# Patient Record
Sex: Female | Born: 1975
Health system: Southern US, Community
[De-identification: ages and names within clinical notes are randomized; demographics above are authoritative.]

## PROBLEM LIST (undated history)

## (undated) DIAGNOSIS — O139 Gestational [pregnancy-induced] hypertension without significant proteinuria, unspecified trimester: Secondary | ICD-10-CM

## (undated) DIAGNOSIS — E039 Hypothyroidism, unspecified: Secondary | ICD-10-CM

## (undated) DIAGNOSIS — O24419 Gestational diabetes mellitus in pregnancy, unspecified control: Secondary | ICD-10-CM

## (undated) HISTORY — PX: WISDOM TOOTH EXTRACTION: SHX21

## (undated) HISTORY — DX: Gestational diabetes mellitus in pregnancy, unspecified control: O24.419

## (undated) HISTORY — PX: APPENDECTOMY: SHX54

## (undated) HISTORY — DX: Hypothyroidism, unspecified: E03.9

---

## 2009-09-03 DIAGNOSIS — A4902 Methicillin resistant Staphylococcus aureus infection, unspecified site: Secondary | ICD-10-CM | POA: Insufficient documentation

## 2011-12-26 DIAGNOSIS — N6459 Other signs and symptoms in breast: Secondary | ICD-10-CM | POA: Insufficient documentation

## 2011-12-26 DIAGNOSIS — Q078 Other specified congenital malformations of nervous system: Secondary | ICD-10-CM | POA: Insufficient documentation

## 2015-06-14 ENCOUNTER — Inpatient Hospital Stay (HOSPITAL_COMMUNITY)
Admission: AD | Admit: 2015-06-14 | Discharge: 2015-06-14 | Disposition: A | Discharge status: Home / Self Care | Payer: 59 | Source: Ambulatory Visit | Attending: Family Medicine | Admitting: Family Medicine

## 2015-06-14 ENCOUNTER — Encounter (HOSPITAL_COMMUNITY): Payer: Self-pay | Admitting: *Deleted

## 2015-06-14 ENCOUNTER — Emergency Department (HOSPITAL_COMMUNITY)
Admission: EM | Admit: 2015-06-14 | Discharge: 2015-06-14 | Discharge status: Against Medical Advice | Payer: 59 | Attending: Emergency Medicine | Admitting: Emergency Medicine

## 2015-06-14 ENCOUNTER — Emergency Department (HOSPITAL_COMMUNITY): Payer: 59

## 2015-06-14 ENCOUNTER — Encounter (HOSPITAL_COMMUNITY): Payer: Self-pay | Admitting: Emergency Medicine

## 2015-06-14 DIAGNOSIS — Z87891 Personal history of nicotine dependence: Secondary | ICD-10-CM | POA: Insufficient documentation

## 2015-06-14 DIAGNOSIS — R1030 Lower abdominal pain, unspecified: Secondary | ICD-10-CM | POA: Diagnosis not present

## 2015-06-14 DIAGNOSIS — R109 Unspecified abdominal pain: Secondary | ICD-10-CM | POA: Diagnosis not present

## 2015-06-14 DIAGNOSIS — N939 Abnormal uterine and vaginal bleeding, unspecified: Secondary | ICD-10-CM | POA: Insufficient documentation

## 2015-06-14 HISTORY — DX: Gestational (pregnancy-induced) hypertension without significant proteinuria, unspecified trimester: O13.9

## 2015-06-14 LAB — COMPREHENSIVE METABOLIC PANEL
ALT: 23 U/L (ref 14–54)
AST: 27 U/L (ref 15–41)
Albumin: 4.4 g/dL (ref 3.5–5.0)
Alkaline Phosphatase: 73 U/L (ref 38–126)
Anion gap: 9 (ref 5–15)
BUN: 9 mg/dL (ref 6–20)
CHLORIDE: 105 mmol/L (ref 101–111)
CO2: 25 mmol/L (ref 22–32)
CREATININE: 0.7 mg/dL (ref 0.44–1.00)
Calcium: 9.4 mg/dL (ref 8.9–10.3)
GFR calc Af Amer: >60 mL/min (ref >60)
GFR calc non Af Amer: >60 mL/min (ref >60)
Glucose, Bld: 105 mg/dL — ABNORMAL HIGH (ref 65–99)
Potassium: 3.7 mmol/L (ref 3.5–5.1)
SODIUM: 139 mmol/L (ref 135–145)
Total Bilirubin: 0.8 mg/dL (ref 0.3–1.2)
Total Protein: 7.8 g/dL (ref 6.5–8.1)

## 2015-06-14 LAB — CBC
HEMATOCRIT: 39.1 % (ref 36.0–46.0)
Hemoglobin: 12.8 g/dL (ref 12.0–15.0)
MCH: 30.1 pg (ref 26.0–34.0)
MCHC: 32.7 g/dL (ref 30.0–36.0)
MCV: 92 fL (ref 78.0–100.0)
PLATELETS: 386 10*3/uL (ref 150–400)
RBC: 4.25 MIL/uL (ref 3.87–5.11)
RDW: 12.8 % (ref 11.5–15.5)
WBC: 9.6 10*3/uL (ref 4.0–10.5)

## 2015-06-14 LAB — I-STAT BETA HCG BLOOD, ED (MC, WL, AP ONLY): I-stat hCG, quantitative: 129.4 m[IU]/mL — ABNORMAL HIGH (ref <5)

## 2015-06-14 LAB — LIPASE, BLOOD: LIPASE: 15 U/L — AB (ref 22–51)

## 2015-06-14 MED ORDER — OXYCODONE-ACETAMINOPHEN 5-325 MG PO TABS: 2.0000 | ORAL_TABLET | ORAL | Status: DC | PRN

## 2015-06-14 MED ORDER — NALBUPHINE HCL 10 MG/ML IJ SOLN
10.0000 mg | Freq: Once | INTRAMUSCULAR | Status: AC
  Administered 2015-06-14: 10 mg via INTRAMUSCULAR
  Filled 2015-06-14: qty 1

## 2015-06-14 NOTE — Discharge Instructions (Signed)

## 2015-06-14 NOTE — ED Notes (Signed)
Pt c/o low medial abdominal pain onset thirty minutes ago and vaginal bleeding. Denies n/v/diarrhea.

## 2015-06-14 NOTE — MAU Provider Note (Signed)
  History   [id number] visiting from [place] in with severe abd pain that started today. States had d and c for miscarriage in aug was noted to have retained products and given cytotec 2-3 wks ago. statred having abd pain and spotting today severe per pt. Pt has hx of one previous ectopic  CSN: [id number]  Arrival date and time: 06/14/15 1620   None     No chief complaint on file.  HPI  OB History    Gravida Para Term Preterm AB TAB SAB Ectopic Multiple Living   No past medical history on file.  No past surgical history on file.  No family history on file.  Social History  Substance Use Topics  . Smoking status: Former Games developer  . Smokeless tobacco: Not on file  . Alcohol Use: Not on file    Allergies:  Allergies  Allergen Reactions  . Other     Amoxacillin    No prescriptions prior to admission    Review of Systems  Constitutional: Negative.   Eyes: Negative.   Respiratory: Negative.   Cardiovascular: Negative.   Gastrointestinal: Positive for abdominal pain.  Genitourinary: Negative.   Musculoskeletal: Negative.   Skin: Negative.   Neurological: Negative.   Endo/Heme/Allergies: Negative.   Psychiatric/Behavioral: Negative.    Physical Exam   There were no vitals taken for this visit.  Physical Exam  Constitutional: She is oriented to person, place, and time. She appears well-developed and well-nourished.  HENT:  Head: Normocephalic.  Eyes: Pupils are equal, round, and reactive to light.  Neck: Normal range of motion.  Cardiovascular: Normal rate, regular rhythm, normal heart sounds and intact distal pulses.   Respiratory: Effort normal and breath sounds normal.  GI: Soft. Bowel sounds are normal. There is tenderness.  Genitourinary:  sm amt vag bleeding  Musculoskeletal: Normal range of motion.  Neurological: She is alert and oriented to person, place, and time. She has normal reflexes.  Skin: Skin is warm and dry.   Psychiatric: She has a normal mood and affect. Her behavior is normal. Judgment and thought content normal.    MAU Course  Procedures  MDM abd pain  Assessment and Plan  Cbc stable, quant 129, will get [place] to r/o ectopic. [place] normal results will d/c home  LAWSON, MARIE DARLENE 06/14/2015, 4:31 PM

## 2015-06-14 NOTE — MAU Note (Signed)
Pt had a D&C in [place] on Aug 22nd. Pt had a f/u visit with MD on Sep 17th and was told everything was fine.  Extreme pressure pain in low abd began at 1230 today.

## 2015-11-02 IMAGING — US US TRANSVAGINAL NON-OB
1 series · 15 of 25 positions shown · non-contrast
Comparison: None.

CLINICAL DATA: 39-year-old G5 P2 ectopic 1 AB1, LMP 02/06/2015 ([DATE]
weeks 2 days), presenting with severe lower abdominal pain and
pelvic pain which began earlier today. Quantitative beta HCG 129.

EXAM:
TRANSABDOMINAL AND TRANSVAGINAL ULTRASOUND OF PELVIS
TECHNIQUE: Both transabdominal and transvaginal ultrasound examinations of the
pelvis were performed. Transabdominal technique was performed for
global imaging of the pelvis including uterus, ovaries, adnexal
regions, and pelvic cul-de-sac. It was necessary to proceed with
endovaginal exam following the transabdominal exam to visualize the
endometrium and ovaries due to incomplete bladder distension.

[Series 1: us transvaginal non-ob · 15 of 100 slices shown]
[im 1/100]
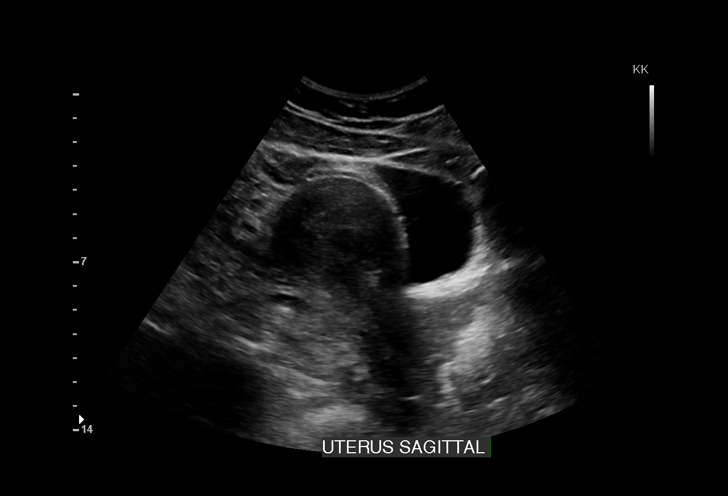
[im 9/100]
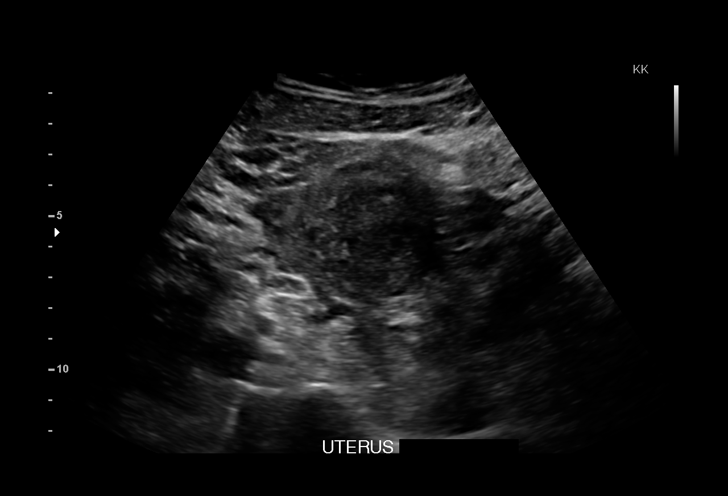
[im 17/100]
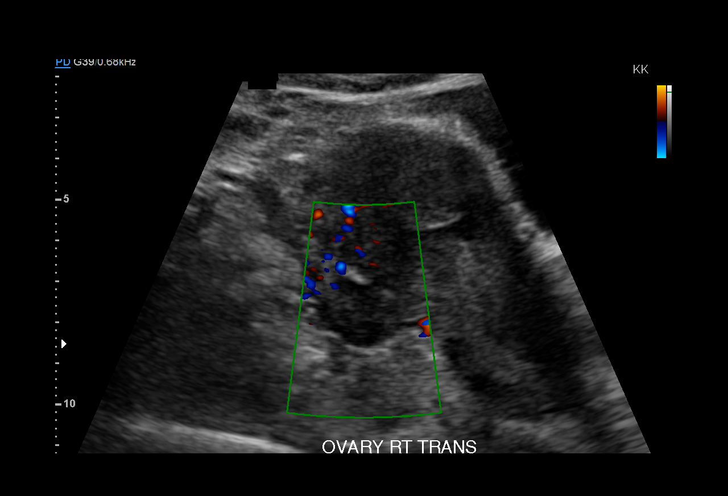
[im 21/100]
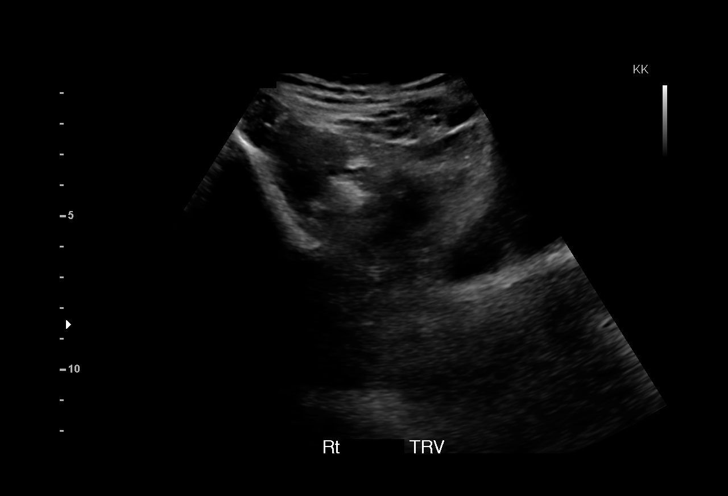
[im 29/100]
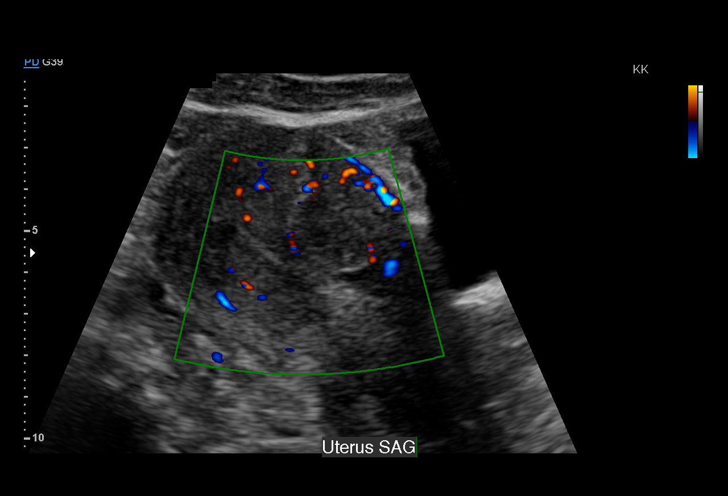
[im 38/100]
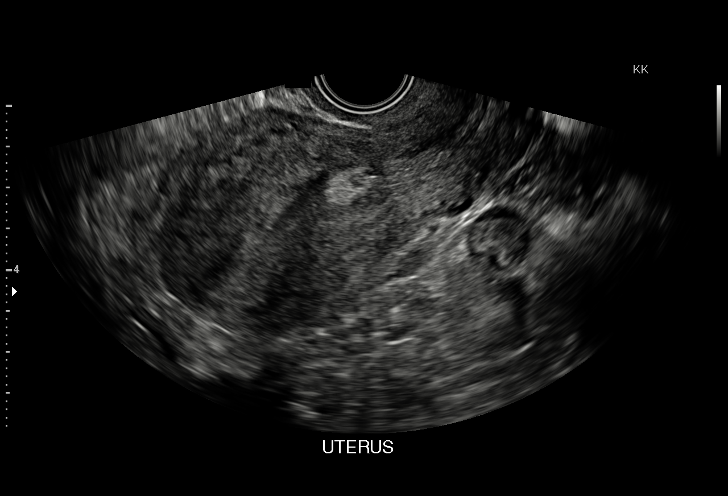
[im 42/100]
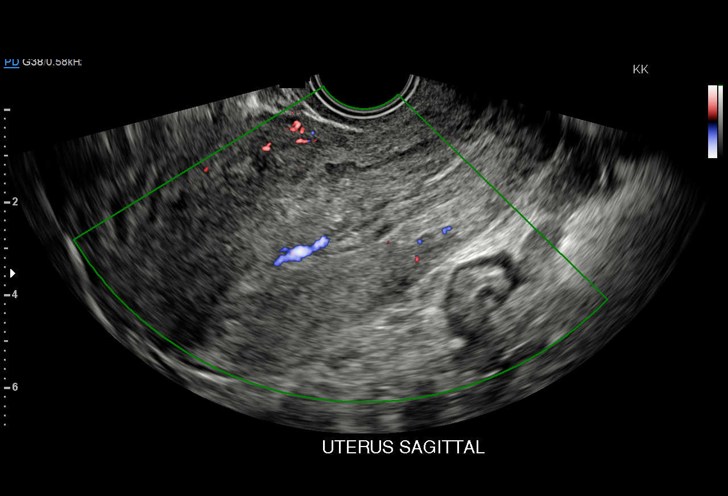
[im 50/100]
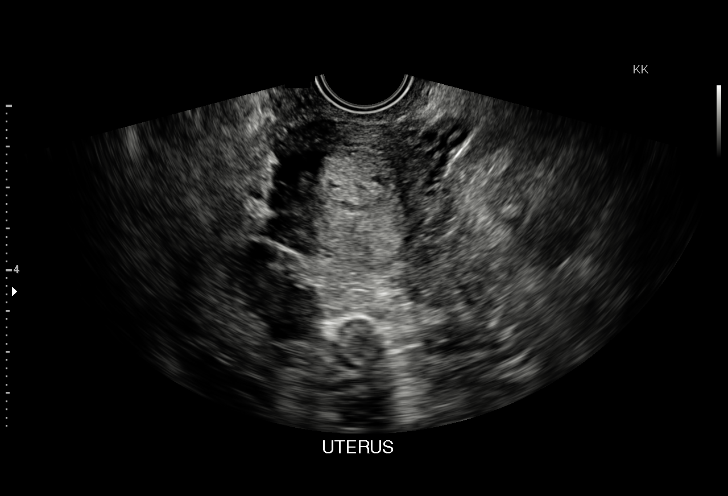
[im 58/100]
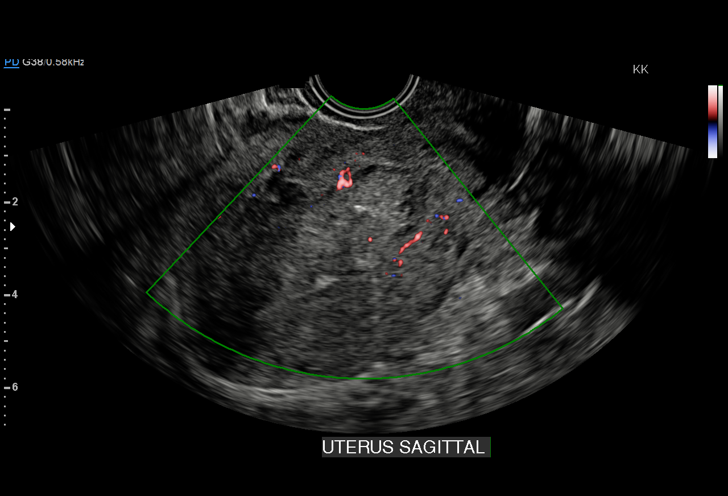
[im 62/100]
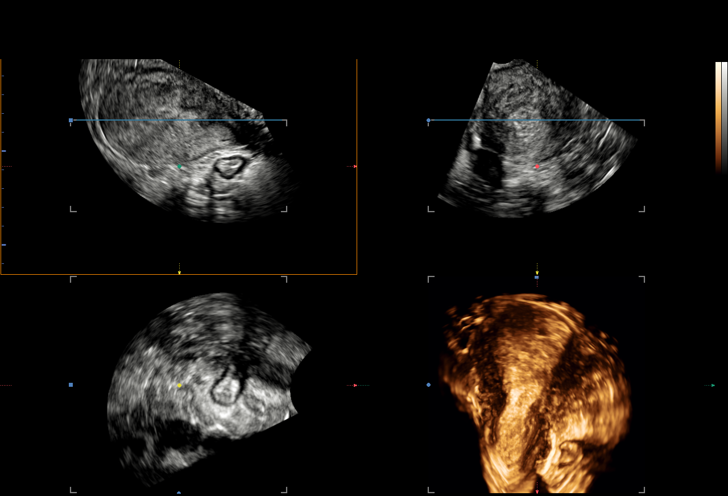
[im 71/100]
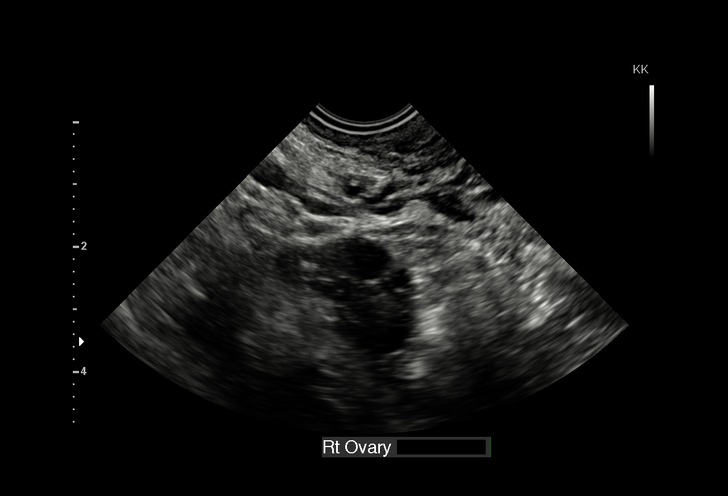
[im 79/100]
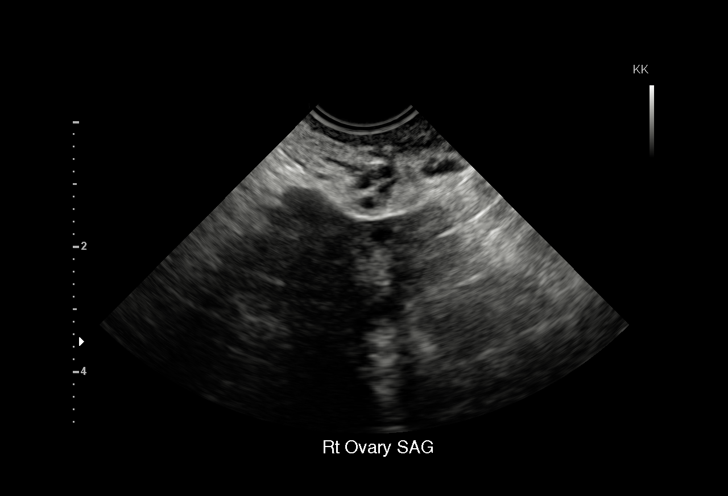
[im 83/100]
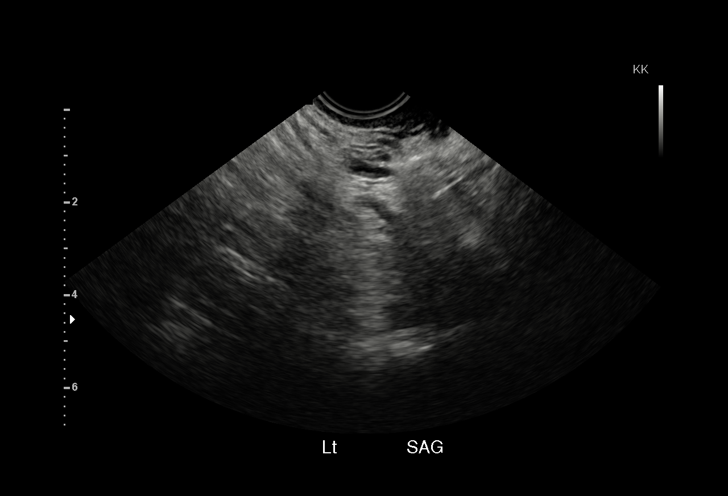
[im 91/100]
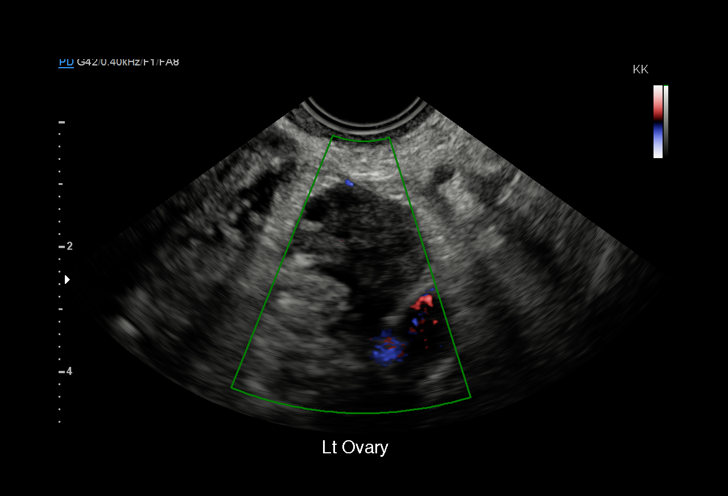
[im 100/100]
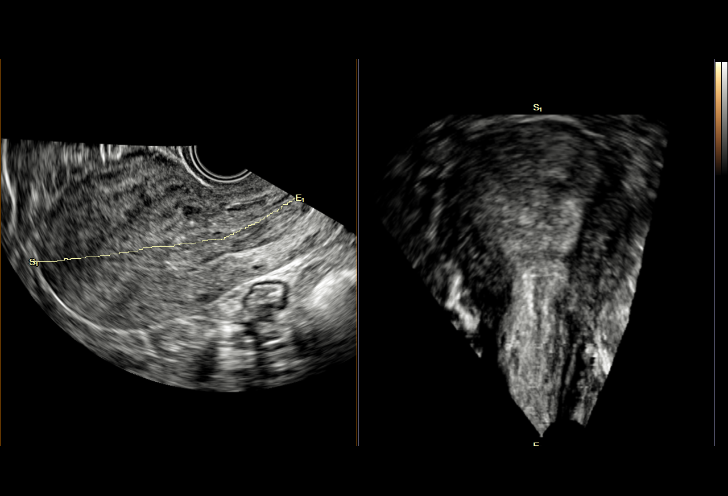

[15 of 25 positions shown; findings below may reference images not displayed]

FINDINGS: Uterus

Measurements: Approximately 10.0 x 5.6 x 5.6 cm. Homogeneous
echotexture without focal fibroid or other myometrial abnormality.
Normal-appearing uterine cervix.

Endometrium

Thickness: Approximately 25 mm. Heterogeneous but predominantly
echogenic material within the entire endometrium, demonstrating
color Doppler flow in the fundal region. According to the ultrasound
technologist, material could be seen moving within the endometrium
at real-time examination. No evidence of intrauterine pregnancy.

Right ovary

Measurements: Approximately 1.7 x 1.3 x 1.4 cm. Small follicular
cysts. No dominant cyst or solid mass. Normal color Doppler flow
within the ovary.

Left ovary

Measurements: Approximately 2.6 x 1.5 x 2.2 cm. Small follicular
cysts. No dominant cyst or solid mass. Normal color Doppler flow
within the ovary.

Other findings

No adnexal masses or free pelvic fluid.
IMPRESSION: 1. Recent/evolving spontaneous abortion with retained products of
conception in the endometrial canal.
2. No evidence of intrauterine pregnancy. No adnexal masses to
suggest ectopic pregnancy.
3. Normal-appearing ovaries.

## 2016-01-13 DIAGNOSIS — E559 Vitamin D deficiency, unspecified: Secondary | ICD-10-CM | POA: Insufficient documentation

## 2016-05-11 DIAGNOSIS — E538 Deficiency of other specified B group vitamins: Secondary | ICD-10-CM | POA: Insufficient documentation

## 2017-08-28 DIAGNOSIS — O09299 Supervision of pregnancy with other poor reproductive or obstetric history, unspecified trimester: Secondary | ICD-10-CM | POA: Insufficient documentation

## 2017-08-28 DIAGNOSIS — E063 Autoimmune thyroiditis: Secondary | ICD-10-CM | POA: Insufficient documentation

## 2017-08-28 DIAGNOSIS — Z3480 Encounter for supervision of other normal pregnancy, unspecified trimester: Secondary | ICD-10-CM | POA: Insufficient documentation

## 2017-08-30 DIAGNOSIS — R87810 Cervical high risk human papillomavirus (HPV) DNA test positive: Secondary | ICD-10-CM | POA: Insufficient documentation

## 2019-09-01 ENCOUNTER — Other Ambulatory Visit: Payer: Self-pay

## 2019-09-01 DIAGNOSIS — Z20822 Contact with and (suspected) exposure to covid-19: Secondary | ICD-10-CM

## 2019-09-02 LAB — NOVEL CORONAVIRUS, NAA: SARS-CoV-2, NAA: NOT DETECTED

## 2019-09-16 ENCOUNTER — Ambulatory Visit: Payer: Self-pay | Attending: Internal Medicine

## 2019-09-16 DIAGNOSIS — Z20822 Contact with and (suspected) exposure to covid-19: Secondary | ICD-10-CM

## 2019-09-18 LAB — NOVEL CORONAVIRUS, NAA: SARS-CoV-2, NAA: NOT DETECTED

## 2020-08-27 ENCOUNTER — Ambulatory Visit: Payer: Self-pay | Attending: Internal Medicine

## 2020-08-27 DIAGNOSIS — Z23 Encounter for immunization: Secondary | ICD-10-CM

## 2020-08-27 NOTE — Progress Notes (Signed)
   Covid-19 Vaccination Clinic  Name:  Tracey Nielsen    MRN: [id number] DOB: November 16, 1975  08/27/2020  [name] was observed post Covid-19 immunization for 15 minutes without incident. She was provided with Vaccine Information Sheet and instruction to access the V-Safe system.   [name] was instructed to call 911 with any severe reactions post vaccine: Marland Kitchen Difficulty breathing  . Swelling of face and throat  . A fast heartbeat  . A bad rash all over body  . Dizziness and weakness   Immunizations Administered    Name Date Dose VIS Date Route   Pfizer COVID-19 Vaccine 08/27/2020  1:30 PM 0.3 mL 07/14/2020 Intramuscular   Manufacturer: [employer], [employer]   Lot: [id number]   NDC: [address]-0

## 2020-09-02 DIAGNOSIS — Q059 Spina bifida, unspecified: Secondary | ICD-10-CM | POA: Insufficient documentation

## 2020-09-09 ENCOUNTER — Other Ambulatory Visit: Payer: Self-pay

## 2020-09-09 DIAGNOSIS — Z20822 Contact with and (suspected) exposure to covid-19: Secondary | ICD-10-CM

## 2020-09-10 LAB — NOVEL CORONAVIRUS, NAA: SARS-CoV-2, NAA: NOT DETECTED

## 2020-09-10 LAB — SARS-COV-2, NAA 2 DAY TAT

## 2021-03-22 LAB — OB RESULTS CONSOLE ABO/RH: RH Type: POSITIVE

## 2021-03-22 LAB — OB RESULTS CONSOLE RUBELLA ANTIBODY, IGM: Rubella: IMMUNE

## 2021-03-22 LAB — OB RESULTS CONSOLE HEPATITIS B SURFACE ANTIGEN: Hepatitis B Surface Ag: NEGATIVE

## 2021-03-22 LAB — OB RESULTS CONSOLE HIV ANTIBODY (ROUTINE TESTING): HIV: NONREACTIVE

## 2021-03-22 LAB — OB RESULTS CONSOLE VARICELLA ZOSTER ANTIBODY, IGG: Varicella: IMMUNE

## 2021-03-22 LAB — HEPATITIS C ANTIBODY: HCV Ab: NEGATIVE

## 2021-03-22 LAB — OB RESULTS CONSOLE GC/CHLAMYDIA
Chlamydia: NEGATIVE
Gonorrhea: NEGATIVE

## 2021-03-22 LAB — OB RESULTS CONSOLE RPR: RPR: NONREACTIVE

## 2021-08-10 ENCOUNTER — Encounter: Payer: Medicaid Other | Attending: Obstetrics and Gynecology | Admitting: Registered"

## 2021-08-10 ENCOUNTER — Other Ambulatory Visit: Payer: Self-pay

## 2021-08-10 ENCOUNTER — Encounter: Payer: Self-pay | Admitting: Registered"

## 2021-08-10 DIAGNOSIS — O24419 Gestational diabetes mellitus in pregnancy, unspecified control: Secondary | ICD-10-CM | POA: Diagnosis not present

## 2021-08-10 NOTE — Progress Notes (Signed)
Patient was seen on 08/10/2021 for Gestational Diabetes self-management class at [facility]. The following learning objectives were met by the patient during this course:  States the definition of Gestational Diabetes States why dietary management is important in controlling blood glucose Describes the effects each nutrient has on blood glucose levels Demonstrates ability to create a balanced meal plan Demonstrates carbohydrate counting  States when to check blood glucose levels Demonstrates proper blood glucose monitoring techniques States the effect of stress and exercise on blood glucose levels States the importance of limiting caffeine and abstaining from alcohol and smoking  Blood glucose monitor given: Patient has meter and is checking blood sugar prior to class   Patient instructed to monitor glucose levels: FBS: 60 - <95; 1 hour: <140; 2 hour: <120  Patient received handouts: Nutrition Diabetes and Pregnancy, including carb counting list  Patient will be seen for follow-up as needed.

## 2021-08-16 ENCOUNTER — Encounter: Payer: Self-pay | Admitting: *Deleted

## 2021-08-16 ENCOUNTER — Other Ambulatory Visit: Payer: Self-pay | Admitting: Obstetrics and Gynecology

## 2021-08-16 DIAGNOSIS — Z363 Encounter for antenatal screening for malformations: Secondary | ICD-10-CM

## 2021-08-23 ENCOUNTER — Ambulatory Visit: Payer: Medicaid Other | Attending: Obstetrics and Gynecology

## 2021-08-23 ENCOUNTER — Other Ambulatory Visit: Payer: Self-pay

## 2021-08-23 ENCOUNTER — Ambulatory Visit (HOSPITAL_BASED_OUTPATIENT_CLINIC_OR_DEPARTMENT_OTHER): Payer: Medicaid Other

## 2021-08-23 ENCOUNTER — Ambulatory Visit: Payer: Medicaid Other | Admitting: *Deleted

## 2021-08-23 VITALS — BP 138/78 | HR 78

## 2021-08-23 DIAGNOSIS — O09523 Supervision of elderly multigravida, third trimester: Secondary | ICD-10-CM | POA: Diagnosis present

## 2021-08-23 DIAGNOSIS — O09299 Supervision of pregnancy with other poor reproductive or obstetric history, unspecified trimester: Secondary | ICD-10-CM | POA: Diagnosis present

## 2021-08-23 DIAGNOSIS — N96 Recurrent pregnancy loss: Secondary | ICD-10-CM | POA: Insufficient documentation

## 2021-08-23 DIAGNOSIS — Z363 Encounter for antenatal screening for malformations: Secondary | ICD-10-CM | POA: Diagnosis not present

## 2021-08-23 NOTE — Progress Notes (Signed)
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   EDC: 10/21/2021 LMP: 01/14/2021 Referring Provider:  Sherlyn Hay, *  EGA: [id number] Counselor: AStaci Righter MS, CGC  OB Hx: G7P2 Date of Appointment: 08/23/2021  Accompanied by: Father of the current pregnancy (Tracey Nielsen Face to Face Time: 30 Minutes   Previous Testing Completed: CBC from 08/09/2021 reviewed. MCV within normal limits. It is unlikely that Tracey Nielsen a beta thalassemia carrier or an alpha thalassemia carrier of the double-gene deletion. Individuals with a normal MCV may be single-gene deletion carriers, but it is unlikely that the current pregnancy would be affected with alpha or beta thalassemia major. JLeanahpreviously completed Non-Invasive Prenatal Screening (NIPS) in this pregnancy (available in Epic under the Encounters tab). The result is low risk. This screening significantly reduces the risk that the current pregnancy has Down syndrome, Trisomy 111 Trisomy 188 and common sex chromosome conditions, however, the risk is not zero given the limitations of NIPS. Additionally, there are many genetic conditions that cannot be detected by NIPS. Tracey Nielsen she and Tracey Espypreviously completed carrier screening. These results are not available for genetic counseling to review at the time of Tracey Nielsen's genetic counseling appointment.    Genetic Counseling:   Genetic counseling briefly met with JYurikoand her reproductive partner, Tracey Nielsen after ultrasound on 08/23/2021. Tracey Nielsen had two previous pregnancies with Trisomy 157 Genetic counseling reviewed with the couple that Tracey Nielsen's Non-Invasive Prenatal Screen (NIPS) from the current pregnancy resulted low risk for Trisomy 18 as well as low risk for other common chromosomal aneuploidies such as Down syndrome. We also reviewed that no obvious major birth defects were  visualized on today's ultrasound. Given the above information it is unlikely for the current pregnancy to have aneuploidy such as Trisomy 18, however, the risk is not zero given the limitations of NIPS and ultrasound. Genetic counseling reviewed the option of amniocentesis for prenatal diagnosis with the couple. Genetic testing that could be performed on an amniocentesis sample includes a fetal karyotype, microarray, and testing for specific syndromes. Genetic counseling described that chromosomal microarray analysis has been found to detect a pathogenic (or likely pathogenic) copy number variant in approximately 1.7% of pregnancies with a normal ultrasound examination result and a normal karyotype. After hearing the above information, Tracey Nielsen amniocentesis for prenatal diagnosis and conveyed she felt comfortable with the risk to the pregnancy given the low risk NIPS result and the apparently normal ultrasound.  Genetic counseling also discussed Tracey Nielsen's history of recurrent pregnancy loss (RPL). Pregnancy losses occur for many reasons, including endocrine factors, environmental agents, immunologic causes, maternal factors, as well as chromosomal and single-gene disorders. The overall rate of pregnancy loss is approximately 20%, with the majority occurring in the first trimester. Chromosomal causes account for 50-70% of first trimester losses but are less commonly cited as a cause for late gestational pregnancy loss. Genetic counseling described that most chromosome abnormalities are a result of a new change in the sperm or egg that conceived the fetus; however, in 3-5% of all couples with RPL one member of the couple is found to have a balanced chromosome rearrangement, called a translocation. A translocation occurs when there is an exchange in chromosome material, such as when a segment of one chromosome breaks off and reattaches to a different chromosome. When the translocation is balanced, there is no  extra or missing genetic material; however, an individual who carries  a balanced translocation has an increased risk to pass unbalanced chromosomes to their offspring. In couples in which one partner is a carrier of a balanced translocation, this can result in an increased risk for infertility, RPL, and offspring with abnormal chromosomes. Due to Tracey Nielsen's history of RPL and two pregnancies with Trisomy 18, parental chromosome analysis was offered. Tracey Nielsen and Tracey Nielsen declined parental chromosome analysis at this time.  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 When a neural tube defect is apparently isolated it is thought to have a multifactorial etiology (combination of genetics and environment). We reviewed that Tracey Nielsen's ultrasound today did not detect spina bifida. Furthermore, the prenatal detection rate with a second trimester ultrasound in a qualified institution is thought to be approximately 90-98%.    Patient Plan:  Proceed with: Routine prenatal care All questions were answered.  Declined: Amniocentesis for prenatal diagnosis, maternal and paternal karyotypes   Thank you for sharing in the care of Tracey Nielsen with [place].  Please do not hesitate to contact us if you have any questions.  Staci Righter, MS, [facility]

## 2021-08-29 ENCOUNTER — Other Ambulatory Visit: Payer: Self-pay

## 2021-09-25 NOTE — L&D Delivery Note (Signed)
°  CNM called to standby for precipitous delivery Birth: At 1652 a viable female was delivered via spontaneous vaginal delivery (Presentation:cephalic;LOA). Nuchal cord present: No.  Shoulders and body delivered in usual fashion. Infant placed directly on mom's abdomen for bonding/skin-to-skin, baby dried and stimulated. Cord clamped x 2 after 1 minute and cut by FOB.  Cord blood collected.  Dr Jackelyn Knife took over for delivery of placenta.   Brand Males, CNM 01/03/235:12 PM

## 2021-09-27 ENCOUNTER — Inpatient Hospital Stay (HOSPITAL_COMMUNITY): Payer: Medicaid Other | Admitting: Anesthesiology

## 2021-09-27 ENCOUNTER — Other Ambulatory Visit: Payer: Self-pay

## 2021-09-27 ENCOUNTER — Inpatient Hospital Stay (HOSPITAL_COMMUNITY)
Admission: AD | Admit: 2021-09-27 | Discharge: 2021-09-30 | DRG: 807 | Disposition: A | Discharge status: Home / Self Care | Payer: Medicaid Other | Attending: Obstetrics and Gynecology | Admitting: Obstetrics and Gynecology

## 2021-09-27 ENCOUNTER — Encounter (HOSPITAL_COMMUNITY): Payer: Self-pay | Admitting: Obstetrics and Gynecology

## 2021-09-27 DIAGNOSIS — Z20822 Contact with and (suspected) exposure to covid-19: Secondary | ICD-10-CM | POA: Diagnosis present

## 2021-09-27 DIAGNOSIS — R030 Elevated blood-pressure reading, without diagnosis of hypertension: Secondary | ICD-10-CM | POA: Diagnosis not present

## 2021-09-27 DIAGNOSIS — Z3A38 38 weeks gestation of pregnancy: Secondary | ICD-10-CM | POA: Diagnosis not present

## 2021-09-27 DIAGNOSIS — Z3689 Encounter for other specified antenatal screening: Secondary | ICD-10-CM | POA: Diagnosis not present

## 2021-09-27 DIAGNOSIS — O1002 Pre-existing essential hypertension complicating childbirth: Secondary | ICD-10-CM | POA: Diagnosis present

## 2021-09-27 DIAGNOSIS — O99824 Streptococcus B carrier state complicating childbirth: Secondary | ICD-10-CM | POA: Diagnosis present

## 2021-09-27 DIAGNOSIS — Z87891 Personal history of nicotine dependence: Secondary | ICD-10-CM

## 2021-09-27 DIAGNOSIS — O114 Pre-existing hypertension with pre-eclampsia, complicating childbirth: Secondary | ICD-10-CM | POA: Diagnosis not present

## 2021-09-27 DIAGNOSIS — O1410 Severe pre-eclampsia, unspecified trimester: Secondary | ICD-10-CM | POA: Diagnosis present

## 2021-09-27 DIAGNOSIS — E039 Hypothyroidism, unspecified: Secondary | ICD-10-CM | POA: Diagnosis present

## 2021-09-27 DIAGNOSIS — Z3A36 36 weeks gestation of pregnancy: Secondary | ICD-10-CM

## 2021-09-27 DIAGNOSIS — O1413 Severe pre-eclampsia, third trimester: Secondary | ICD-10-CM

## 2021-09-27 DIAGNOSIS — O99284 Endocrine, nutritional and metabolic diseases complicating childbirth: Secondary | ICD-10-CM | POA: Diagnosis present

## 2021-09-27 DIAGNOSIS — O24424 Gestational diabetes mellitus in childbirth, insulin controlled: Secondary | ICD-10-CM

## 2021-09-27 LAB — CBC WITH DIFFERENTIAL/PLATELET
Abs Immature Granulocytes: 0.15 10*3/uL — ABNORMAL HIGH (ref 0.00–0.07)
Abs Immature Granulocytes: 0.15 10*3/uL — ABNORMAL HIGH (ref 0.00–0.07)
Basophils Absolute: 0.1 10*3/uL (ref 0.0–0.1)
Basophils Absolute: 0 10*3/uL (ref 0.0–0.1)
Basophils Relative: 0 %
Basophils Relative: 0 %
Eosinophils Absolute: 0.1 10*3/uL (ref 0.0–0.5)
Eosinophils Absolute: 0.1 10*3/uL (ref 0.0–0.5)
Eosinophils Relative: 1 %
Eosinophils Relative: 1 %
HCT: 42.6 % (ref 36.0–46.0)
HCT: 42.2 % (ref 36.0–46.0)
Hemoglobin: 14 g/dL (ref 12.0–15.0)
Hemoglobin: 13.8 g/dL (ref 12.0–15.0)
Immature Granulocytes: 1 %
Immature Granulocytes: 1 %
Lymphocytes Relative: 12 %
Lymphocytes Relative: 9 %
Lymphs Abs: 1.4 10*3/uL (ref 0.7–4.0)
Lymphs Abs: 1.1 10*3/uL (ref 0.7–4.0)
MCH: 30 pg (ref 26.0–34.0)
MCH: 30.1 pg (ref 26.0–34.0)
MCHC: 32.9 g/dL (ref 30.0–36.0)
MCHC: 32.7 g/dL (ref 30.0–36.0)
MCV: 91.4 fL (ref 80.0–100.0)
MCV: 92.1 fL (ref 80.0–100.0)
Monocytes Absolute: 1 10*3/uL (ref 0.1–1.0)
Monocytes Absolute: 0.8 10*3/uL (ref 0.1–1.0)
Monocytes Relative: 8 %
Monocytes Relative: 6 %
Neutro Abs: 9 10*3/uL — ABNORMAL HIGH (ref 1.7–7.7)
Neutro Abs: 10.6 10*3/uL — ABNORMAL HIGH (ref 1.7–7.7)
Neutrophils Relative %: 78 %
Neutrophils Relative %: 83 %
Platelets: 240 10*3/uL (ref 150–400)
Platelets: 227 10*3/uL (ref 150–400)
RBC: 4.66 MIL/uL (ref 3.87–5.11)
RBC: 4.58 MIL/uL (ref 3.87–5.11)
RDW: 14.3 % (ref 11.5–15.5)
RDW: 14.1 % (ref 11.5–15.5)
WBC: 11.6 10*3/uL — ABNORMAL HIGH (ref 4.0–10.5)
WBC: 12.7 10*3/uL — ABNORMAL HIGH (ref 4.0–10.5)
nRBC: 0 % (ref 0.0–0.2)
nRBC: 0 % (ref 0.0–0.2)

## 2021-09-27 LAB — URINALYSIS, ROUTINE W REFLEX MICROSCOPIC
Bilirubin Urine: NEGATIVE
Glucose, UA: NEGATIVE mg/dL
Hgb urine dipstick: NEGATIVE
Ketones, ur: 5 mg/dL — AB
Nitrite: NEGATIVE
Protein, ur: 100 mg/dL — AB
Specific Gravity, Urine: 1.014 (ref 1.005–1.030)
pH: 6 (ref 5.0–8.0)

## 2021-09-27 LAB — CBC
HCT: 41.3 % (ref 36.0–46.0)
Hemoglobin: 13.8 g/dL (ref 12.0–15.0)
MCH: 30.9 pg (ref 26.0–34.0)
MCHC: 33.4 g/dL (ref 30.0–36.0)
MCV: 92.6 fL (ref 80.0–100.0)
Platelets: 236 10*3/uL (ref 150–400)
RBC: 4.46 MIL/uL (ref 3.87–5.11)
RDW: 14.1 % (ref 11.5–15.5)
WBC: 10.5 10*3/uL (ref 4.0–10.5)
nRBC: 0 % (ref 0.0–0.2)

## 2021-09-27 LAB — COMPREHENSIVE METABOLIC PANEL
ALT: 15 U/L (ref 0–44)
AST: 19 U/L (ref 15–41)
Albumin: 2.7 g/dL — ABNORMAL LOW (ref 3.5–5.0)
Alkaline Phosphatase: 131 U/L — ABNORMAL HIGH (ref 38–126)
Anion gap: 10 (ref 5–15)
BUN: 9 mg/dL (ref 6–20)
CO2: 21 mmol/L — ABNORMAL LOW (ref 22–32)
Calcium: 10 mg/dL (ref 8.9–10.3)
Chloride: 104 mmol/L (ref 98–111)
Creatinine, Ser: 0.55 mg/dL (ref 0.44–1.00)
GFR, Estimated: >60 mL/min (ref >60)
Glucose, Bld: 134 mg/dL — ABNORMAL HIGH (ref 70–99)
Potassium: 3.9 mmol/L (ref 3.5–5.1)
Sodium: 135 mmol/L (ref 135–145)
Total Bilirubin: 0.7 mg/dL (ref 0.3–1.2)
Total Protein: 6 g/dL — ABNORMAL LOW (ref 6.5–8.1)

## 2021-09-27 LAB — TYPE AND SCREEN
ABO/RH(D): O POS
Antibody Screen: NEGATIVE

## 2021-09-27 LAB — GLUCOSE, CAPILLARY
Glucose-Capillary: 109 mg/dL — ABNORMAL HIGH (ref 70–99)
Glucose-Capillary: 123 mg/dL — ABNORMAL HIGH (ref 70–99)
Glucose-Capillary: 100 mg/dL — ABNORMAL HIGH (ref 70–99)
Glucose-Capillary: 111 mg/dL — ABNORMAL HIGH (ref 70–99)
Glucose-Capillary: 144 mg/dL — ABNORMAL HIGH (ref 70–99)

## 2021-09-27 LAB — RESP PANEL BY RT-PCR (FLU A&B, COVID) ARPGX2
Influenza A by PCR: NEGATIVE
Influenza B by PCR: NEGATIVE
SARS Coronavirus 2 by RT PCR: NEGATIVE

## 2021-09-27 LAB — RPR: RPR Ser Ql: NONREACTIVE

## 2021-09-27 LAB — PROTEIN / CREATININE RATIO, URINE
Creatinine, Urine: 72.65 mg/dL
Protein Creatinine Ratio: 0.81 mg/mg{Cre} — ABNORMAL HIGH (ref 0.00–0.15)
Total Protein, Urine: 59 mg/dL

## 2021-09-27 LAB — GROUP B STREP BY PCR: Group B strep by PCR: POSITIVE — AB

## 2021-09-27 MED ORDER — METHYLERGONOVINE MALEATE 0.2 MG PO TABS: 0.2000 mg | ORAL_TABLET | ORAL | Status: DC | PRN

## 2021-09-27 MED ORDER — HYDRALAZINE HCL 20 MG/ML IJ SOLN: 10.0000 mg | INTRAMUSCULAR | Status: DC | PRN

## 2021-09-27 MED ORDER — OXYTOCIN-SODIUM CHLORIDE 30-0.9 UT/500ML-% IV SOLN
1.0000 m[IU]/min | INTRAVENOUS | Status: DC
  Administered 2021-09-27: 2 m[IU]/min via INTRAVENOUS

## 2021-09-27 MED ORDER — COCONUT OIL OIL
1.0000 "application " | TOPICAL_OIL | Status: DC | PRN
  Administered 2021-09-27: 1 via TOPICAL

## 2021-09-27 MED ORDER — DIBUCAINE (PERIANAL) 1 % EX OINT: 1.0000 "application " | TOPICAL_OINTMENT | CUTANEOUS | Status: DC | PRN

## 2021-09-27 MED ORDER — MEASLES, MUMPS & RUBELLA VAC IJ SOLR: 0.5000 mL | Freq: Once | INTRAMUSCULAR | Status: DC

## 2021-09-27 MED ORDER — ONDANSETRON HCL 4 MG/2ML IJ SOLN
4.0000 mg | Freq: Four times a day (QID) | INTRAMUSCULAR | Status: DC | PRN
  Administered 2021-09-27: 4 mg via INTRAVENOUS
  Filled 2021-09-27: qty 2

## 2021-09-27 MED ORDER — ACETAMINOPHEN 500 MG PO TABS: 1000.0000 mg | ORAL_TABLET | Freq: Four times a day (QID) | ORAL | Status: DC | PRN

## 2021-09-27 MED ORDER — PHENYLEPHRINE 40 MCG/ML (10ML) SYRINGE FOR IV PUSH (FOR BLOOD PRESSURE SUPPORT): 80.0000 ug | PREFILLED_SYRINGE | INTRAVENOUS | Status: DC | PRN

## 2021-09-27 MED ORDER — SOD CITRATE-CITRIC ACID 500-334 MG/5ML PO SOLN: 30.0000 mL | ORAL | Status: DC | PRN

## 2021-09-27 MED ORDER — LACTATED RINGERS IV SOLN: 500.0000 mL | Freq: Once | INTRAVENOUS | Status: DC

## 2021-09-27 MED ORDER — MISOPROSTOL 25 MCG QUARTER TABLET
25.0000 ug | ORAL_TABLET | ORAL | Status: DC | PRN
  Administered 2021-09-27: 25 ug via VAGINAL

## 2021-09-27 MED ORDER — LIOTHYRONINE SODIUM 5 MCG PO TABS
5.0000 ug | ORAL_TABLET | Freq: Every day | ORAL | Status: DC
  Administered 2021-09-27 – 2021-09-30 (×4): 5 ug via ORAL
  Filled 2021-09-27 (×5): qty 1

## 2021-09-27 MED ORDER — LABETALOL HCL 5 MG/ML IV SOLN: 40.0000 mg | INTRAVENOUS | Status: DC | PRN

## 2021-09-27 MED ORDER — CEFAZOLIN SODIUM-DEXTROSE 2-4 GM/100ML-% IV SOLN
2.0000 g | Freq: Once | INTRAVENOUS | Status: AC
  Administered 2021-09-27: 2 g via INTRAVENOUS
  Filled 2021-09-27: qty 100

## 2021-09-27 MED ORDER — TERBUTALINE SULFATE 1 MG/ML IJ SOLN: 0.2500 mg | Freq: Once | INTRAMUSCULAR | Status: DC | PRN

## 2021-09-27 MED ORDER — SIMETHICONE 80 MG PO CHEW: 80.0000 mg | CHEWABLE_TABLET | ORAL | Status: DC | PRN

## 2021-09-27 MED ORDER — PRENATAL MULTIVITAMIN CH
1.0000 | ORAL_TABLET | Freq: Every day | ORAL | Status: DC
  Administered 2021-09-28 – 2021-09-30 (×3): 1 via ORAL
  Filled 2021-09-27 (×3): qty 1

## 2021-09-27 MED ORDER — LIDOCAINE HCL (PF) 1 % IJ SOLN: 30.0000 mL | INTRAMUSCULAR | Status: DC | PRN

## 2021-09-27 MED ORDER — LACTATED RINGERS IV SOLN: INTRAVENOUS | Status: DC

## 2021-09-27 MED ORDER — MISOPROSTOL 25 MCG QUARTER TABLET
ORAL_TABLET | ORAL | Status: AC
  Filled 2021-09-27: qty 1

## 2021-09-27 MED ORDER — LABETALOL HCL 5 MG/ML IV SOLN
20.0000 mg | INTRAVENOUS | Status: DC | PRN
  Administered 2021-09-27: 20 mg via INTRAVENOUS
  Filled 2021-09-27: qty 4

## 2021-09-27 MED ORDER — LABETALOL HCL 5 MG/ML IV SOLN: 20.0000 mg | INTRAVENOUS | Status: DC | PRN

## 2021-09-27 MED ORDER — MAGNESIUM SULFATE 40 GM/1000ML IV SOLN
2.0000 g/h | INTRAVENOUS | Status: AC
  Administered 2021-09-27: 2 g/h via INTRAVENOUS
  Filled 2021-09-27: qty 1000

## 2021-09-27 MED ORDER — ACETAMINOPHEN 325 MG PO TABS: 650.0000 mg | ORAL_TABLET | ORAL | Status: DC | PRN

## 2021-09-27 MED ORDER — WITCH HAZEL-GLYCERIN EX PADS: 1.0000 "application " | MEDICATED_PAD | CUTANEOUS | Status: DC | PRN

## 2021-09-27 MED ORDER — DIPHENHYDRAMINE HCL 25 MG PO CAPS: 25.0000 mg | ORAL_CAPSULE | Freq: Four times a day (QID) | ORAL | Status: DC | PRN

## 2021-09-27 MED ORDER — FENTANYL-BUPIVACAINE-NACL 0.5-0.125-0.9 MG/250ML-% EP SOLN
12.0000 mL/h | EPIDURAL | Status: DC | PRN
  Administered 2021-09-27: 12 mL/h via EPIDURAL
  Filled 2021-09-27: qty 250

## 2021-09-27 MED ORDER — IBUPROFEN 600 MG PO TABS
600.0000 mg | ORAL_TABLET | Freq: Four times a day (QID) | ORAL | Status: DC
  Administered 2021-09-27 – 2021-09-30 (×12): 600 mg via ORAL
  Filled 2021-09-27 (×12): qty 1

## 2021-09-27 MED ORDER — LEVOTHYROXINE SODIUM 25 MCG PO TABS
100.0000 ug | ORAL_TABLET | Freq: Every day | ORAL | Status: DC
  Administered 2021-09-27 – 2021-09-30 (×4): 100 ug via ORAL
  Filled 2021-09-27: qty 4
  Filled 2021-09-27: qty 1
  Filled 2021-09-27 (×2): qty 4
  Filled 2021-09-27: qty 1

## 2021-09-27 MED ORDER — ACETAMINOPHEN 325 MG PO TABS
650.0000 mg | ORAL_TABLET | ORAL | Status: DC | PRN
  Administered 2021-09-28 – 2021-09-29 (×2): 650 mg via ORAL
  Filled 2021-09-27 (×2): qty 2

## 2021-09-27 MED ORDER — MAGNESIUM HYDROXIDE 400 MG/5ML PO SUSP: 30.0000 mL | ORAL | Status: DC | PRN

## 2021-09-27 MED ORDER — NIFEDIPINE ER OSMOTIC RELEASE 30 MG PO TB24
30.0000 mg | ORAL_TABLET | Freq: Every day | ORAL | Status: DC
  Administered 2021-09-27 – 2021-09-28 (×2): 30 mg via ORAL
  Filled 2021-09-27 (×2): qty 1

## 2021-09-27 MED ORDER — ONDANSETRON HCL 4 MG PO TABS: 4.0000 mg | ORAL_TABLET | ORAL | Status: DC | PRN

## 2021-09-27 MED ORDER — LACTATED RINGERS IV SOLN: 500.0000 mL | INTRAVENOUS | Status: DC | PRN

## 2021-09-27 MED ORDER — METHYLERGONOVINE MALEATE 0.2 MG/ML IJ SOLN: 0.2000 mg | INTRAMUSCULAR | Status: DC | PRN

## 2021-09-27 MED ORDER — EPHEDRINE 5 MG/ML INJ: 10.0000 mg | INTRAVENOUS | Status: DC | PRN

## 2021-09-27 MED ORDER — ONDANSETRON HCL 4 MG/2ML IJ SOLN: 4.0000 mg | INTRAMUSCULAR | Status: DC | PRN

## 2021-09-27 MED ORDER — MAGNESIUM SULFATE BOLUS VIA INFUSION
4.0000 g | Freq: Once | INTRAVENOUS | Status: AC
  Administered 2021-09-27: 4 g via INTRAVENOUS
  Filled 2021-09-27: qty 1000

## 2021-09-27 MED ORDER — BENZOCAINE-MENTHOL 20-0.5 % EX AERO
1.0000 "application " | INHALATION_SPRAY | CUTANEOUS | Status: DC | PRN
  Administered 2021-09-27: 1 via TOPICAL
  Filled 2021-09-27: qty 56

## 2021-09-27 MED ORDER — SENNOSIDES-DOCUSATE SODIUM 8.6-50 MG PO TABS
2.0000 | ORAL_TABLET | Freq: Every day | ORAL | Status: DC
  Administered 2021-09-28 – 2021-09-30 (×3): 2 via ORAL
  Filled 2021-09-27 (×3): qty 2

## 2021-09-27 MED ORDER — LIDOCAINE-EPINEPHRINE (PF) 2 %-1:200000 IJ SOLN
INTRAMUSCULAR | Status: DC | PRN
Start: 2021-09-27 — End: 2021-09-27
  Administered 2021-09-27: 3 mL via EPIDURAL

## 2021-09-27 MED ORDER — OXYCODONE-ACETAMINOPHEN 5-325 MG PO TABS: 1.0000 | ORAL_TABLET | ORAL | Status: DC | PRN

## 2021-09-27 MED ORDER — LABETALOL HCL 5 MG/ML IV SOLN: 80.0000 mg | INTRAVENOUS | Status: DC | PRN

## 2021-09-27 MED ORDER — PHENYLEPHRINE 40 MCG/ML (10ML) SYRINGE FOR IV PUSH (FOR BLOOD PRESSURE SUPPORT)
80.0000 ug | PREFILLED_SYRINGE | INTRAVENOUS | Status: DC | PRN
  Filled 2021-09-27: qty 10

## 2021-09-27 MED ORDER — OXYTOCIN-SODIUM CHLORIDE 30-0.9 UT/500ML-% IV SOLN
2.5000 [IU]/h | INTRAVENOUS | Status: DC
  Filled 2021-09-27: qty 500

## 2021-09-27 MED ORDER — OXYTOCIN BOLUS FROM INFUSION
333.0000 mL | Freq: Once | INTRAVENOUS | Status: AC
  Administered 2021-09-27: 333 mL via INTRAVENOUS

## 2021-09-27 MED ORDER — DIPHENHYDRAMINE HCL 50 MG/ML IJ SOLN: 12.5000 mg | INTRAMUSCULAR | Status: DC | PRN

## 2021-09-27 MED ORDER — CEFAZOLIN SODIUM-DEXTROSE 1-4 GM/50ML-% IV SOLN
1.0000 g | Freq: Three times a day (TID) | INTRAVENOUS | Status: DC
  Administered 2021-09-27: 1 g via INTRAVENOUS
  Filled 2021-09-27 (×3): qty 50

## 2021-09-27 MED ORDER — INSULIN ASPART 100 UNIT/ML IJ SOLN
0.0000 [IU] | INTRAMUSCULAR | Status: DC
  Administered 2021-09-27: 2 [IU] via SUBCUTANEOUS
  Administered 2021-09-27: 1 [IU] via SUBCUTANEOUS

## 2021-09-27 MED ORDER — MAGNESIUM SULFATE 40 GM/1000ML IV SOLN
2.0000 g/h | INTRAVENOUS | Status: DC
  Filled 2021-09-27: qty 1000

## 2021-09-27 MED ORDER — OXYCODONE HCL 5 MG PO TABS: 5.0000 mg | ORAL_TABLET | ORAL | Status: DC | PRN

## 2021-09-27 MED ORDER — FENTANYL CITRATE (PF) 100 MCG/2ML IJ SOLN
50.0000 ug | INTRAMUSCULAR | Status: DC | PRN
  Administered 2021-09-27: 50 ug via INTRAVENOUS
  Administered 2021-09-27: 100 ug via INTRAVENOUS
  Filled 2021-09-27 (×2): qty 2

## 2021-09-27 MED ORDER — OXYCODONE-ACETAMINOPHEN 5-325 MG PO TABS: 2.0000 | ORAL_TABLET | ORAL | Status: DC | PRN

## 2021-09-27 MED ORDER — ZOLPIDEM TARTRATE 5 MG PO TABS: 5.0000 mg | ORAL_TABLET | Freq: Every evening | ORAL | Status: DC | PRN

## 2021-09-27 MED ORDER — TETANUS-DIPHTH-ACELL PERTUSSIS 5-2.5-18.5 LF-MCG/0.5 IM SUSY: 0.5000 mL | PREFILLED_SYRINGE | Freq: Once | INTRAMUSCULAR | Status: DC

## 2021-09-27 MED ORDER — OXYCODONE HCL 5 MG PO TABS: 10.0000 mg | ORAL_TABLET | ORAL | Status: DC | PRN

## 2021-09-27 NOTE — Anesthesia Preprocedure Evaluation (Signed)
Anesthesia Evaluation  Patient identified by MRN, date of birth, ID band Patient awake    Reviewed: Allergy & Precautions, Patient's Chart, lab work & pertinent test results  History of Anesthesia Complications Negative for: history of anesthetic complications  Airway Mallampati: II  TM Distance: >3 FB Neck ROM: Full    Dental  (+) Teeth Intact, Dental Advisory Given   Pulmonary neg pulmonary ROS, former smoker,    breath sounds clear to auscultation       Cardiovascular hypertension,  Rhythm:Regular  Gestational HTN on MgSO4   Neuro/Psych neg Headaches, neg Seizures Spina bifida with no neurologic deficits  negative psych ROS   GI/Hepatic negative GI ROS, Neg liver ROS,   Endo/Other  diabetes, GestationalHypothyroidism   Renal/GU negative Renal ROS     Musculoskeletal   Abdominal   Peds  Hematology negative hematology ROS (+)   Anesthesia Other Findings   Reproductive/Obstetrics (+) Pregnancy                             Anesthesia Physical Anesthesia Plan  ASA: 2  Anesthesia Plan: Epidural   Post-op Pain Management:    Induction:   PONV Risk Score and Plan: 2 and Treatment may vary due to age or medical condition  Airway Management Planned:   Additional Equipment: Fetal Monitoring  Intra-op Plan:   Post-operative Plan:   Informed Consent: I have reviewed the patients History and Physical, chart, labs and discussed the procedure including the risks, benefits and alternatives for the proposed anesthesia with the patient or authorized representative who has indicated his/her understanding and acceptance.       Plan Discussed with: Anesthesiologist  Anesthesia Plan Comments:         Anesthesia Quick Evaluation

## 2021-09-27 NOTE — Progress Notes (Signed)
Comfortable with epidural Afeb, VSS, BP normal currently FHT-120-130, Cat I, ctx q 2-3 min VE-2-3/50/-3, vtx, AROM clear  BP ok, continue to monitor, continue magnesium On pitocin, AROM done, monitor progress Continue Ancef for +GBS Continue CBG and SSI, good control so far

## 2021-09-27 NOTE — MAU Note (Signed)
Offered pt tylenol for headache; pt declines at this time.

## 2021-09-27 NOTE — H&P (Signed)
Tracey Nielsen is a 46 y.o. female [phone] [redacted]w[redacted]d presenting for headache and elevated blood pressures at home, 160-170/100s. She reports no LOF, VB, Contractions. Normal FM. Headache mild, hasn't taken anything for it. Endorses seeing some floaters. No RUQ pain.  Pregnancy c/b: Chronic hypertension with superimposed preeclampsia diagnosed at 33 weeks 4 days by U P/C 0.544. Mild blood pressure elevations noted throughout prenatal care. Has IOL scheduled for [redacted]w[redacted]d A2GDM: on NPH 35 U QHS. Last growth 11/29 EFW 65% (4lb5oz), HC 7%, AC 91% Hypothyroidism: Levothyroxine daily, cytomel 5 mg daily AMA: low risk MaterniT21 H/o trisomy 18 x 2, pregnancies terminated: low risk MaterniT21, declined amniocentesis  OB History     Gravida  9   Para  2   Term  2   Preterm  0   AB  6   Living  2      SAB  5   IAB      Ectopic  1   Multiple      Live Births             Past Medical History:  Diagnosis Date   Gestational diabetes    Hypothyroidism    Pregnancy induced hypertension    Past Surgical History:  Procedure Laterality Date   APPENDECTOMY     WISDOM TOOTH EXTRACTION     Family History: family history includes Cancer in her maternal aunt; Heart disease in her mother. Social History:  reports that she quit smoking about 6 years ago. Her smoking use included cigarettes. She does not have any smokeless tobacco history on file. She reports that she does not drink alcohol and does not use drugs.     Maternal Diabetes: Yes:  Diabetes Type:  Insulin/Medication controlled Genetic Screening: Normal Maternal Ultrasounds/Referrals: Normal Fetal Ultrasounds or other Referrals:  Referred to Materal Fetal Medicine  due to concern for small head circumference on office scan, on MFM scan head circumference within normal range as noted above Maternal Substance Abuse:  No Significant Maternal Medications:  Meds include: Other:  levothyroxine, cytomel, NPH Significant Maternal  Lab Results:  None Other Comments:  None  Review of Systems Per HPI Exam Physical Exam  Dilation: 1.5 Effacement (%): 50 Station: -3 Exam by:: Joline Salt, RN Blood pressure 137/82, pulse 86, temperature 98.4 F (36.9 C), temperature source Oral, resp. rate 16, height 5\' 5"  (1.651 m), weight 89.6 kg, last menstrual period 01/14/2021, SpO2 97 %. Gen: NAD, resting comfortably CVS: normal pulses Lungs: nonlabored respirations Abd: Gravid abdomen Ext: no calf edema or tenderness   Fetal testing: 150bpm, moderate variability, + accels, no decels Toco: ctx irregular  Prenatal labs: ABO, Rh:  --/--/O POS (01/03 0157) Antibody: NEG (01/03 0157) Rubella: Immune (06/28 0000) RPR: Nonreactive (06/28 0000)  HBsAg: Negative (06/28 0000)  HIV: Non-reactive (06/28 0000)  GBS: POSITIVE/-- (01/03 0157)   Assessment/Plan: 07-22-1979 @ [redacted]w[redacted]d, chronic hypertension with superimposed severe preeclampsia CHTN with severe preeclampsia by severe range Bps at home and in MAU, required IV labetalol.        - Admit to L&D for IOL given severe preeclampsia > 34 weeks         - C/o headache (mild), unclear if severe feature, will treat with tylenol       - CBC/CMP within normal limits       - Mag 4g followed by 2g/hr, monitor for signs/symptoms of magnesium toxicity 2. Late preterm       - Discussed current late preterm  antenatal corticosteroid data with patient and her husband, with benefits including reducing risk of needing respiratory treatment and possible harms including increased risk of neurodevelopmental disorders. Patient and her husband have elected not to receive corticosteroid course.  3. Fetal wellbeing: cat I tracing, continuous EFM 4. IOL: cytotec PV q4hr PRN cervical ripening 5. A2GDM: monitor CBG, ISS per protocol 6. Hypothyroid: continue home meds 7. Pain control: epidural upon patient request 8. GBS positive: h/o amoxicillin rash, ancef ordered   Charlett Nose 09/27/2021, 5:12 AM

## 2021-09-27 NOTE — Plan of Care (Signed)
°  Problem: Education: Goal: Knowledge of condition will improve Outcome: Progressing Goal: Individualized Educational Video(s) Outcome: Progressing   Problem: Coping: Goal: Ability to identify and utilize available resources and services will improve Outcome: Progressing   Problem: Life Cycle: Goal: Chance of risk for complications during the postpartum period will decrease Outcome: Progressing   Problem: Role Relationship: Goal: Ability to demonstrate positive interaction with newborn will improve Outcome: Progressing

## 2021-09-27 NOTE — Anesthesia Procedure Notes (Signed)
Epidural Patient location during procedure: OB Start time: 09/27/2021 2:25 PM End time: 09/27/2021 2:35 PM  Staffing Anesthesiologist: Val Eagle, MD Performed: anesthesiologist   Preanesthetic Checklist Completed: patient identified, IV checked, site marked, risks and benefits discussed, monitors and equipment checked, pre-op evaluation and timeout performed  Epidural Patient position: sitting Prep: DuraPrep Patient monitoring: heart rate, continuous pulse ox and blood pressure Approach: midline Location: L3-L4 Injection technique: LOR saline  Needle:  Needle type: Tuohy  Needle gauge: 17 G Needle length: 9 cm Needle insertion depth: 6 cm Catheter type: closed end flexible Catheter size: 19 Gauge Catheter at skin depth: 12 cm Test dose: negative and 2% lidocaine with Epi 1:200 K  Assessment Events: blood not aspirated, injection not painful, no injection resistance, no paresthesia and negative IV test  Additional Notes Reason for block:procedure for pain

## 2021-09-27 NOTE — Progress Notes (Signed)
As per my last note, AROM done around 1600, VE 2-3/50/-3 at that time I left the hospital.  I received a call at 1639 that she had progressed to complete and had some vaginal bleeding, I immediately proceeded back to the hospital. When I entered the room, baby had been out for one minute.  I delivered placenta spontaneously and intact.  Fundus firm.  Second degree lac repaired with 3-0 Vicryl Rapide.  EBL 250 cc.  Baby went to warmer briefly, then back to mom.  Will continue magnesium for 24 hrs, monitor BP closely

## 2021-09-27 NOTE — MAU Provider Note (Signed)
History     CSN: [id number]  Arrival date and time: 09/27/21 0012   Event Date/Time   First Provider Initiated Contact with Patient 09/27/21 0113      Chief Complaint  Patient presents with   Hypertension   Headache   46 y.o. [id number] @36 .4 wks presenting with elevated BP and HA. Reports BP 170/100 at home last night. HA is mild, rates pain 2/10. Has not taken anything for it. Denies visual disturbances, RUQ pain, SOB, and CP.     OB History     Gravida  9   Para  2   Term  2   Preterm  0   AB  6   Living  2      SAB  5   IAB      Ectopic  1   Multiple      Live Births              Past Medical History:  Diagnosis Date   Gestational diabetes    Hypothyroidism    Pregnancy induced hypertension     Past Surgical History:  Procedure Laterality Date   APPENDECTOMY     WISDOM TOOTH EXTRACTION      Family History  Problem Relation Age of Onset   Heart disease Mother    Cancer Maternal Aunt     Social History   Tobacco Use   Smoking status: Former    Types: Cigarettes    Quit date: 03/14/2015    Years since quitting: 6.5  Vaping Use   Vaping Use: Never used  Substance Use Topics   Alcohol use: No   Drug use: No    Allergies:  Allergies  Allergen Reactions   Amoxicillin Rash    No cross-reaction with penicillin    Medications Prior to Admission  Medication Sig Dispense Refill Last Dose   aspirin EC 81 MG tablet Take 81 mg by mouth daily. Swallow whole.   09/26/2021   Cholecalciferol (VITAMIN D3) 50 MCG (2000 UT) TABS Take by mouth.   09/26/2021   Cyanocobalamin (VITAMIN B-12) 5000 MCG TBDP Take by mouth.   09/26/2021   DHA-EPA-GLA PO Take by mouth.   09/26/2021   levothyroxine (SYNTHROID) 100 MCG tablet Take 100 mcg by mouth daily before breakfast.   09/26/2021   liothyronine (CYTOMEL) 5 MCG tablet Take 5 mcg by mouth daily.   09/26/2021   Prenatal Vit-Fe Fumarate-FA (PRENATAL MULTIVITAMIN) TABS tablet Take 1 tablet by mouth daily at 12  noon.   09/26/2021   Ubiquinol 100 MG CAPS Take by mouth.   09/26/2021   acetaminophen (TYLENOL) 500 MG tablet Take 1,000 mg by mouth every 6 (six) hours as needed for moderate pain or headache.      Acetylcysteine (NAC) 500 MG CAPS Take by mouth.      ibuprofen (ADVIL,MOTRIN) 600 MG tablet Take 600 mg by mouth every 6 (six) hours as needed for moderate pain (dnc).      metFORMIN (GLUCOPHAGE-XR) 500 MG 24 hr tablet Take 500 mg by mouth daily with breakfast.      oxyCODONE-acetaminophen (PERCOCET/ROXICET) 5-325 MG per tablet Take 2 tablets by mouth every 4 (four) hours as needed for severe pain. 30 tablet 0     Review of Systems  Eyes:  Negative for visual disturbance.  Respiratory:  Negative for shortness of breath.   Cardiovascular:  Negative for chest pain.  Neurological:  Positive for headaches.  Physical Exam   Blood pressure (!) 155/81,  pulse 84, temperature 98.4 F (36.9 C), temperature source Oral, resp. rate 16, height 5\' 5"  (1.651 m), weight 89.6 kg, last menstrual period 01/14/2021, SpO2 97 %. Patient Vitals for the past 24 hrs:  BP Temp Temp src Pulse Resp SpO2 Height Weight  09/27/21 0145 (!) 155/81 -- -- 84 -- 97 % -- --  09/27/21 0130 (!) 150/81 -- -- 83 -- 97 % -- --  09/27/21 0115 (!) 169/97 -- -- 82 -- 98 % -- --  09/27/21 0100 (!) 149/89 -- -- 94 -- 99 % -- --  09/27/21 0050 (!) 165/86 -- -- 84 -- -- -- --  09/27/21 0036 (!) 165/87 98.4 F (36.9 C) Oral 79 16 100 % 5\' 5"  (1.651 m) 89.6 kg    Physical Exam Vitals and nursing note reviewed.  Constitutional:      General: She is not in acute distress.    Appearance: Normal appearance.  HENT:     Head: Normocephalic and atraumatic.  Cardiovascular:     Rate and Rhythm: Normal rate.  Pulmonary:     Effort: Pulmonary effort is normal. No respiratory distress.  Musculoskeletal:        General: Normal range of motion.     Cervical back: Normal range of motion.  Neurological:     General: No focal deficit present.      Mental Status: She is alert and oriented to person, place, and time.  Psychiatric:        Mood and Affect: Mood normal.        Behavior: Behavior normal.  EFM: 145 bpm, mod variability, + accels, no decels Toco: irreg  Patient Vitals for the past 24 hrs:  BP Temp Temp src Pulse Resp SpO2 Height Weight  09/27/21 0145 (!) 155/81 -- -- 84 -- 97 % -- --  09/27/21 0130 (!) 150/81 -- -- 83 -- 97 % -- --  09/27/21 0115 (!) 169/97 -- -- 82 -- 98 % -- --  09/27/21 0100 (!) 149/89 -- -- 94 -- 99 % -- --  09/27/21 0050 (!) 165/86 -- -- 84 -- -- -- --  09/27/21 0036 (!) 165/87 98.4 F (36.9 C) Oral 79 16 100 % 5\' 5"  (1.651 m) 89.6 kg   MAU Course  Procedures Labetalol  MDM Chart reviewed: pregnancy complicated by AMA, [id number] on Insulin, hypothyroidism, PEC dx at 34wks, and previous two children with Trisomy 35. 0040: Discussed presentation and clinical findings with Dr. Brien Mates, plan for admit. Assessment and Plan   1. Severe pre-eclampsia in third trimester   2. [redacted] weeks gestation of pregnancy   3. NST (non-stress test) reactive    Admit to LD Mngt per primary [facility] provider  Julianne Handler, CNM 09/27/2021, 1:48 AM

## 2021-09-27 NOTE — Progress Notes (Signed)
Feeling ctx Afeb, VSS. BP 130-160/70-80 FHT-130s, Cat I, irreg ctx  Will continue magnesium for seizure prophylaxis, prn labetalol, monitor sugars with SSI.  Will see what response is to this cytotec in an hour or so and decide next step for induction

## 2021-09-27 NOTE — Lactation Note (Addendum)
This note was copied from a baby's chart. Lactation Consultation Note  Patient Name: Tracey Nielsen OMVEH'M Date: 09/27/2021 Reason for consult: L&D Initial assessment;Mother's request;Difficult latch;Late-preterm 34-36.6wks;Maternal endocrine disorder;Breastfeeding assistance Age:46 hours LC attempted latch but infant licking and tasting colostrum off the nipple. Mom need hand pump to pre pump 5-10 min before latching to elongate nipple.  Mom taught hand expression to give colostrum while we work on latching.   LC returned with a spoon infant fed 1 ml and then latched with signs of milk transfer.  Mom to receive further LC support on the floor.  Maternal Data Has patient been taught Hand Expression?: Yes Does the patient have breastfeeding experience prior to this delivery?: Yes How long did the patient breastfeed?: 2 previous children but not able to get them to latch.  Feeding Mother's Current Feeding Choice: Breast Milk  LATCH Score                    Lactation Tools Discussed/Used    Interventions Interventions: Assisted with latch;Breast feeding basics reviewed;Skin to skin;Breast massage;Hand express;Pre-pump if needed;Breast compression;Adjust position;Expressed milk;Education  Discharge    Consult Status Consult Status: Follow-up from L&D Date: 09/28/21 Follow-up type: In-patient    Tracey Skoczylas  Nielsen 09/27/2021, 5:34 PM

## 2021-09-27 NOTE — MAU Note (Signed)
..  Tracey Nielsen is a 46 y.o. at [redacted]w[redacted]d here in MAU reporting: Elevated blood pressure at home (170/102) and a mild headache (has not taken any medication for it.) Reports some floaters in her vision. Denies epigastric pain.  Denies ctx, vaginal bleeding or leaking of fluid. +FM.   Pain score: 2/10 Vitals:   09/27/21 0036  BP: (!) 165/87  Pulse: 79  Resp: 16  Temp: 98.4 F (36.9 C)  SpO2: 100%     FHT:160 Lab orders placed from triage: UA

## 2021-09-27 NOTE — Anesthesia Postprocedure Evaluation (Signed)
Anesthesia Post Note  Patient: Tracey Nielsen  Procedure(s) Performed: AN AD HOC LABOR EPIDURAL     Patient location during evaluation: Mother Baby Anesthesia Type: Epidural Level of consciousness: awake and alert Pain management: pain level controlled Vital Signs Assessment: post-procedure vital signs reviewed and stable Respiratory status: spontaneous breathing, nonlabored ventilation and respiratory function stable Cardiovascular status: stable Postop Assessment: no headache, no backache and epidural receding Anesthetic complications: no   No notable events documented.  Last Vitals:  Vitals:   09/27/21 1815 09/27/21 1830  BP: (!) 149/86 (!) 152/83  Pulse: 70 73  Resp:    Temp:    SpO2:      Last Pain:  Vitals:   09/27/21 1840  TempSrc:   PainSc: 0-No pain                 Jesica Goheen

## 2021-09-28 LAB — CBC
HCT: 36.4 % (ref 36.0–46.0)
Hemoglobin: 12.1 g/dL (ref 12.0–15.0)
MCH: 30.9 pg (ref 26.0–34.0)
MCHC: 33.2 g/dL (ref 30.0–36.0)
MCV: 92.9 fL (ref 80.0–100.0)
Platelets: 233 10*3/uL (ref 150–400)
RBC: 3.92 MIL/uL (ref 3.87–5.11)
RDW: 14.4 % (ref 11.5–15.5)
WBC: 14.8 10*3/uL — ABNORMAL HIGH (ref 4.0–10.5)
nRBC: 0 % (ref 0.0–0.2)

## 2021-09-28 NOTE — Progress Notes (Signed)
Patient is eating, ambulating, voiding.  Pain control is good.  Vitals:   09/27/21 2021 09/27/21 2317 09/27/21 2343 09/28/21 0349  BP: (!) 150/79 126/68 121/65 114/69  Pulse: 74 90 79 84  Resp: 16 18 18 20   Temp:  97.9 F (36.6 C)  98.1 F (36.7 C)  TempSrc:  Oral  Oral  SpO2:  96%  98%  Weight:      Height:        Fundus firm Perineum without swelling.  Lab Results  Component Value Date   WBC 14.8 (H) 09/28/2021   HGB 12.1 09/28/2021   HCT 36.4 09/28/2021   MCV 92.9 09/28/2021   PLT 233 09/28/2021    --/--/O POS (01/03 0157)/RI   A/P Post partum day 1, severe preeclampsia. 24 hours of magnesium up this afternoon. On Procardia 30 mg XL- will increase if necessary, currently BPs stable. A2GDM- sugars stable. On levothyroxine. Otherwise routine care.  Expect d/c in 1-2 days if BPs stable.    07-22-1979

## 2021-09-28 NOTE — Progress Notes (Signed)
BPs 110s-130s/60-70s  Off Magnesium now.  Continue watching BPs. On Procardia 30 mg XL

## 2021-09-29 MED ORDER — NIFEDIPINE ER OSMOTIC RELEASE 60 MG PO TB24
60.0000 mg | ORAL_TABLET | Freq: Every day | ORAL | Status: DC
  Administered 2021-09-29: 60 mg via ORAL
  Filled 2021-09-29: qty 1

## 2021-09-29 MED ORDER — NIFEDIPINE ER OSMOTIC RELEASE 60 MG PO TB24
60.0000 mg | ORAL_TABLET | Freq: Every day | ORAL | Status: DC
  Administered 2021-09-30: 60 mg via ORAL
  Filled 2021-09-29: qty 1

## 2021-09-29 NOTE — Progress Notes (Signed)
Patient ID: Tracey Nielsen, female   DOB: 03/26/1976, 46 y.o.   MRN: [id number] Pt had some elevated BP at lunchtime but have improved this evening.  Baby has to stay another night so will observe patient blood pressures overnight and hope for d/c in AM on procardia XL 60 mg

## 2021-09-29 NOTE — Progress Notes (Signed)
Post Partum Day 2 Subjective: no complaints, reports no HA and tired but feeling well.  Baby bottlefeeding this AM.   Objective: Blood pressure (!) 156/78, pulse 82, temperature 98 F (36.7 C), temperature source Oral, resp. rate 19, height 5\' 5"  (1.651 m), weight 89.6 kg, last menstrual period 01/14/2021, SpO2 97 %, unknown if currently breastfeeding.  Physical Exam:  General: alert and cooperative Lochia: appropriate Uterine Fundus: firm   Recent Labs    09/27/21 1800 09/28/21 0614  HGB 13.8 12.1  HCT 42.2 36.4    Assessment/Plan: Pt s/p magnesium x 24 hours with CHTN and superimposed preeclampsia.  BP slightly trending up so increased procardia to 60XL for this AM dose.  Will see if that controls BP throughout day and possible d/c this PM if so.   LOS: 2 days   Logan Bores 09/29/2021, 9:26 AM

## 2021-09-29 NOTE — Lactation Note (Signed)
This note was copied from a baby's chart. Lactation Consultation Note  Patient Name: Tracey Nielsen OXBDZ'H Date: 09/29/2021 Reason for consult: Late-preterm 34-36.6wks;Initial assessment Age:46 hours  Today is Mom's 1st day putting infant to breast. During consult, I observed Mom successfully latch infant, who latched with relative ease. As to be expected for her gestation, infant suckled infrequently, so was bottle-fed until showing signs of satiety. Parents and MGM reported that the yellow Similac nipple was much too fast. I attempted the Nfant Standard nipple, but the swallows were too loud. The Nfant Slow flow was comfortable for infant and she was able to self-pace. SLP was called to make them aware of need for SLP consult.   Mom is interested in supplementing at the breast. She will call for me for the next feeding (unless SLP is here for a feeding assessment). Mom is also aware that I would like to observe her pump as she finds pumping painful.   A handout given was given about Cytomel from [facility] [facility] to discuss with her pediatrician.  Lurline Hare [facility] 09/29/2021, 11:27 AM

## 2021-09-29 NOTE — Lactation Note (Signed)
This note was copied from a baby's chart. Lactation Consultation Note  Patient Name: Tracey Nielsen TDHRC'B Date: 09/29/2021 Reason for consult: Follow-up assessment;Maternal endocrine disorder;Late-preterm 34-36.6wks Age:46 hours  Infant did very, very well with being supplemented at the breast. Using the 5 Fr & syringe, infant was able to suckle and withdraw the formula from the syringe without needing to push the plunger. She fed at breast until she showed signs of contentment (11+ mL).  Mom was sized with size 21 flanges & Mom was more comfortable with pumping. Mom was provided the handout on LPIs. Parents have been shown how to wash pumping and supplemental parts.   Plan: Offer breast and then follow with bottle until infant is content (unless it appears that feeding at the breast is affecting infant's ability with the bottle. If that happens, offer bottle first at the next feeding). Parents will be flexible with feeding plan (eg bottle versus breast first) as determined by infant's energy level).  Pump after feedings.   Parents were pleased with consult. Lurline Hare [facility] 09/29/2021, 3:07 PM

## 2021-09-30 MED ORDER — IBUPROFEN 600 MG PO TABS: 600.0000 mg | ORAL_TABLET | Freq: Four times a day (QID) | ORAL | 0 refills | Status: DC | PRN

## 2021-09-30 MED ORDER — NIFEDIPINE ER 60 MG PO TB24: 60.0000 mg | ORAL_TABLET | Freq: Every day | ORAL | 1 refills | Status: DC

## 2021-09-30 NOTE — Lactation Note (Signed)
This note was copied from a baby's chart. Lactation Consultation Note  Patient Name: Tracey Nielsen Date: 09/30/2021 Reason for consult: Follow-up assessment;Mother's request;Late-preterm 34-36.6wks;Infant < 6lbs;Breastfeeding assistance Age:46 hours  Infant mostly bottle feeding formula. Mom pumped x1, yesterday.  Mom encouraged to use DEBP q 3hrs for 15 min. Mom not able to get a stork pump for home since out of network. Mom to contact her insurance to get a pump for home. Mom also has a WIC appt coming up to complete her certification.   LC reviewed hand pump use with Mom q 3hrs for 10 min each breast with 21 flange. Mom currently not latching, offering bottles only. Mom encouraged to offer more volume 30 ml or more if infant not going to breast.   Infant seen by SLP Cathi Roan. LC alerted SLP Mom mostly bottle feeding and encouraged to offer more volume.  RN, Salena Saner, reach out to provider to see if want to increase caloric intake with 22 cal formula prior to discharge.   Maternal Data    Feeding Mother's Current Feeding Choice: Breast Milk and Formula Nipple Type: Nfant Slow Flow (purple)  LATCH Score                    Lactation Tools Discussed/Used Tools: Pump;Flanges Flange Size: 21 Breast pump type: Double-Electric Breast Pump Pump Education: Setup, frequency, and cleaning;Milk Storage Reason for Pumping: increase stimulation Pumping frequency: every 3 hrs for 15 min  Interventions Interventions: Breast feeding basics reviewed;Expressed milk;DEBP;Education;LC Services brochure;Infant Driven Feeding Algorithm education  Discharge Discharge Education: Engorgement and breast care;Warning signs for feeding baby Pump: Manual  Consult Status Consult Status: Follow-up Date: 10/01/21 Follow-up type: In-patient    Tracey Nielsen 09/30/2021, 2:33 PM

## 2021-09-30 NOTE — Discharge Summary (Signed)
Postpartum Discharge Summary       Patient Name: Tracey Nielsen DOB: 11-03-1975 MRN: [id number]  Date of admission: 09/27/2021 Delivery date:09/27/2021  Delivering provider: Brand Males  Date of discharge: 09/30/2021  Admitting diagnosis: Preeclampsia, severe [O14.10] Intrauterine pregnancy: [redacted]w[redacted]d     Secondary diagnosis:  Principal Problem:   Preeclampsia, severe Active Problems:   Gestational diabetes mellitus in childbirth, insulin controlled   SVD (spontaneous vaginal delivery)  Additional problems: superimposed pre-eclampsia, indicated preterm delivery    Discharge diagnosis: Preterm Pregnancy Delivered, CHTN with superimposed preeclampsia, and GDM A2                                              Post partum procedures: NA Augmentation: AROM, Pitocin, and Cytotec Complications: None  Hospital course: Induction of Labor With Vaginal Delivery   46 y.o. yo [phone] at [redacted]w[redacted]d was admitted to the hospital 09/27/2021 for induction of labor.  Indication for induction: Preeclampsia.  Patient had an uncomplicated labor course as follows: Membrane Rupture Time/Date: 3:57 PM ,09/27/2021   Delivery Method:Vaginal, Spontaneous  Episiotomy: None  Lacerations:  2nd degree  Details of delivery can be found in separate delivery note.  Patient had a routine postpartum course. Patient is discharged home 09/30/21.  Newborn Data: Birth date:09/27/2021  Birth time:4:52 PM  Gender:Female  Living status:Living  Apgars:8 ,9  Weight:2807 g   Magnesium Sulfate received: Yes: Seizure prophylaxis BMZ received: No Rhophylac:N/A   Physical exam  Vitals:   09/29/21 2340 09/30/21 0409 09/30/21 0748 09/30/21 1250  BP: (!) 151/88 (!) 147/86 (!) 151/79 (!) 146/73  Pulse: 81 74 81 79  Resp: 19 18 20 18   Temp: 97.8 F (36.6 C) 98.7 F (37.1 C) 97.8 F (36.6 C) 98 F (36.7 C)  TempSrc: Oral Oral Oral Oral  SpO2: 97% 98% 97% 97%  Weight:      Height:       General: alert, cooperative,  and no distress Lochia: appropriate Uterine Fundus: firm Incision: DVT Evaluation: No evidence of DVT seen on physical exam. Labs: Lab Results  Component Value Date   WBC 14.8 (H) 09/28/2021   HGB 12.1 09/28/2021   HCT 36.4 09/28/2021   MCV 92.9 09/28/2021   PLT 233 09/28/2021   CMP Latest Ref Rng & Units 09/27/2021  Glucose 70 - 99 mg/dL 11/25/2021)  BUN 6 - 20 mg/dL 9  Creatinine 466(Z - 9.93 mg/dL 5.70  Sodium 1.77 - 939 mmol/L 135  Potassium 3.5 - 5.1 mmol/L 3.9  Chloride 98 - 111 mmol/L 104  CO2 22 - 32 mmol/L 21(L)  Calcium 8.9 - 10.3 mg/dL 030  Total Protein 6.5 - 8.1 g/dL 6.0(L)  Total Bilirubin 0.3 - 1.2 mg/dL 0.7  Alkaline Phos 38 - 126 U/L 131(H)  AST 15 - 41 U/L 19  ALT 0 - 44 U/L 15   Edinburgh Score: Edinburgh Postnatal Depression Scale Screening Tool 09/28/2021  I have been able to laugh and see the funny side of things. 0  I have looked forward with enjoyment to things. 0  I have blamed myself unnecessarily when things went wrong. 1  I have been anxious or worried for no good reason. 0  I have felt scared or panicky for no good reason. 0  Things have been getting on top of me. 1  I have been so unhappy that  I have had difficulty sleeping. 0  I have felt sad or miserable. 0  I have been so unhappy that I have been crying. 0  The thought of harming myself has occurred to me. 0  Edinburgh Postnatal Depression Scale Total 2      After visit meds:  Allergies as of 09/30/2021       Reactions   Amoxicillin Rash   No cross-reaction with penicillin        Medication List     STOP taking these medications    aspirin EC 81 MG tablet   DHA-EPA-GLA PO   insulin NPH Human 100 UNIT/ML injection Commonly known as: NOVOLIN N   metFORMIN 500 MG 24 hr tablet Commonly known as: GLUCOPHAGE-XR   NAC 500 MG Caps Generic drug: Acetylcysteine   prenatal multivitamin Tabs tablet   Ubiquinol 100 MG Caps   Vitamin B-12 5000 MCG Tbdp   Vitamin D3 50 MCG (2000 UT)  Tabs       TAKE these medications    ibuprofen 600 MG tablet Commonly known as: ADVIL Take 1 tablet (600 mg total) by mouth every 6 (six) hours as needed.   ibuprofen 600 MG tablet Commonly known as: ADVIL Take 1 tablet (600 mg total) by mouth every 6 (six) hours as needed.   levothyroxine 100 MCG tablet Commonly known as: SYNTHROID Take 100 mcg by mouth daily before breakfast.   liothyronine 5 MCG tablet Commonly known as: CYTOMEL Take 5 mcg by mouth daily.   NIFEdipine 60 MG 24 hr tablet Commonly known as: ADALAT CC Take 1 tablet (60 mg total) by mouth daily. Start taking on: October 01, 2021               Durable Medical Equipment  (From admission, onward)           Start     Ordered   09/29/21 1409  For home use only DME double electric breast pump  Once       Comments: Z39.1   09/29/21 1408              Discharge Care Instructions  (From admission, onward)           Start     Ordered   09/30/21 0000  Discharge wound care:       Comments: For a cesarean delivery: You may wash incision with soap and water.  Do not soak or submerge the incision for 2 weeks. Keep incision dry. You may need to keep a sanitary pad or panty liner between the incision and your clothing for comfort and to keep the incision dry. If you note drainage, increased pain, or increased redness of the incision, then please notify your physician.   09/30/21 1345   09/30/21 0000  If the dressing is still on your incision site when you go home, remove it on the third day after your surgery date. Remove dressing if it begins to fall off, or if it is dirty or damaged before the third day.       Comments: For a cesarean delivery   09/30/21 1345             Discharge home in stable condition Infant Feeding: Bottle an dbreast Infant Disposition:home with mother Discharge instruction: per After Visit Summary and Postpartum booklet. Activity: Advance as tolerated. Pelvic  rest for 6 weeks.  Diet: routine diet Anticipated Birth Control: Unsure Postpartum Appointment:2-3 days Future Appointments:No future appointments. Follow up Visit:  Follow-up Information     Meisinger, Tawanna Coolerodd, MD Follow up.   Specialty: Obstetrics and Gynecology Why: Blood pressure check in office in 3-4 days Contact information: [address], [address] [place] [address] [phone]         [facility] Follow up in 3 day(s).   Contact information: [address]  [address] [place] [address] [phone]                     09/30/2021 Waynard ReedsKendra Hollie Wojahn, MD

## 2021-10-01 ENCOUNTER — Inpatient Hospital Stay (HOSPITAL_COMMUNITY)
Admission: AD | Admit: 2021-10-01 | Payer: Medicaid Other | Source: Home / Self Care | Admitting: Obstetrics and Gynecology

## 2021-10-01 ENCOUNTER — Inpatient Hospital Stay (HOSPITAL_COMMUNITY): Payer: Medicaid Other

## 2021-10-02 ENCOUNTER — Other Ambulatory Visit: Payer: Self-pay

## 2021-10-02 ENCOUNTER — Inpatient Hospital Stay (HOSPITAL_COMMUNITY)
Admission: AD | Admit: 2021-10-02 | Discharge: 2021-10-03 | Disposition: A | Discharge status: Home / Self Care | Payer: Medicaid Other | Source: Ambulatory Visit | Attending: Obstetrics and Gynecology | Admitting: Obstetrics and Gynecology

## 2021-10-02 ENCOUNTER — Encounter (HOSPITAL_COMMUNITY): Payer: Self-pay | Admitting: Obstetrics and Gynecology

## 2021-10-02 DIAGNOSIS — O165 Unspecified maternal hypertension, complicating the puerperium: Secondary | ICD-10-CM | POA: Diagnosis not present

## 2021-10-02 LAB — COMPREHENSIVE METABOLIC PANEL
ALT: 26 U/L (ref 0–44)
AST: 24 U/L (ref 15–41)
Albumin: 3.1 g/dL — ABNORMAL LOW (ref 3.5–5.0)
Alkaline Phosphatase: 97 U/L (ref 38–126)
Anion gap: 8 (ref 5–15)
BUN: 21 mg/dL — ABNORMAL HIGH (ref 6–20)
CO2: 24 mmol/L (ref 22–32)
Calcium: 9 mg/dL (ref 8.9–10.3)
Chloride: 106 mmol/L (ref 98–111)
Creatinine, Ser: 0.79 mg/dL (ref 0.44–1.00)
GFR, Estimated: >60 mL/min (ref >60)
Glucose, Bld: 139 mg/dL — ABNORMAL HIGH (ref 70–99)
Potassium: 3.1 mmol/L — ABNORMAL LOW (ref 3.5–5.1)
Sodium: 138 mmol/L (ref 135–145)
Total Bilirubin: 0.6 mg/dL (ref 0.3–1.2)
Total Protein: 6.6 g/dL (ref 6.5–8.1)

## 2021-10-02 LAB — CBC
HCT: 39.5 % (ref 36.0–46.0)
Hemoglobin: 13.2 g/dL (ref 12.0–15.0)
MCH: 30.8 pg (ref 26.0–34.0)
MCHC: 33.4 g/dL (ref 30.0–36.0)
MCV: 92.1 fL (ref 80.0–100.0)
Platelets: 355 10*3/uL (ref 150–400)
RBC: 4.29 MIL/uL (ref 3.87–5.11)
RDW: 13.5 % (ref 11.5–15.5)
WBC: 9.8 10*3/uL (ref 4.0–10.5)
nRBC: 0 % (ref 0.0–0.2)

## 2021-10-02 NOTE — MAU Provider Note (Signed)
History     CSN: [id number]  Arrival date and time: 10/02/21 2154   Event Date/Time   First Provider Initiated Contact with Patient 10/02/21 2235      Chief Complaint  Patient presents with   Hypertension   HPI Tracey Nielsen is a 46 y.o. [id number] at 6 days post partum who presents with hypertension. Had SVD on 1/3 after being induced for preeclampsia. Received 24 hrs of mag postpartum. Was discharged home on procardia 15 xl daily. Procardia 30 added as evening dose yesterday. Reports taking 60 mg this morning & 30 mg this evening. BP at home was severe range & was instructed to come to MAU by Dr. Terri Piedra. Denies headache, visual disturbance, or epigastric pain.   OB History     Gravida  9   Para  3   Term  2   Preterm  1   AB  6   Living  3      SAB  5   IAB      Ectopic  1   Multiple  0   Live Births  1           Past Medical History:  Diagnosis Date   Gestational diabetes    Hypothyroidism    Pregnancy induced hypertension     Past Surgical History:  Procedure Laterality Date   APPENDECTOMY     WISDOM TOOTH EXTRACTION      Family History  Problem Relation Age of Onset   Heart disease Mother    Cancer Maternal Aunt     Social History   Tobacco Use   Smoking status: Former    Types: Cigarettes    Quit date: 03/14/2015    Years since quitting: 6.5  Vaping Use   Vaping Use: Never used  Substance Use Topics   Alcohol use: No   Drug use: No    Allergies:  Allergies  Allergen Reactions   Amoxicillin Rash    No cross-reaction with penicillin    No medications prior to admission.    Review of Systems  Constitutional: Negative.   Eyes:  Negative for visual disturbance.  Gastrointestinal: Negative.   Genitourinary: Negative.   Neurological:  Negative for headaches.  Physical Exam   Blood pressure (!) 141/86, pulse 85, temperature 98.4 F (36.9 C), temperature source Oral, resp. rate 16, height 5\' 5"  (1.651 m), weight 81.6 kg,  SpO2 97 %, unknown if currently breastfeeding.  Patient Vitals for the past 24 hrs:  BP Temp Temp src Pulse Resp SpO2 Height Weight  10/02/21 2345 (!) 141/86 -- -- 85 -- 97 % -- --  10/02/21 2330 138/89 -- -- 86 -- 99 % -- --  10/02/21 2315 (!) 142/88 -- -- 86 -- 98 % -- --  10/02/21 2300 (!) 150/86 -- -- 90 -- 99 % -- --  10/02/21 2245 (!) 158/84 -- -- 86 -- 99 % -- --  10/02/21 2230 (!) 148/84 -- -- 90 -- 98 % -- --  10/02/21 2215 (!) 150/83 -- -- 92 -- 100 % -- --  10/02/21 2213 (!) 163/88 -- -- 92 -- -- -- --  10/02/21 2204 (!) 153/93 98.4 F (36.9 C) Oral 88 16 100 % 5\' 5"  (1.651 m) 81.6 kg    Physical Exam Vitals and nursing note reviewed.  Constitutional:      General: She is not in acute distress.    Appearance: Normal appearance.  HENT:     Head: Normocephalic and atraumatic.  Eyes:     General: No scleral icterus.    Conjunctiva/sclera: Conjunctivae normal.  Cardiovascular:     Rate and Rhythm: Normal rate.     Heart sounds: Normal heart sounds.  Pulmonary:     Effort: Pulmonary effort is normal. No respiratory distress.     Breath sounds: Normal breath sounds. No wheezing.  Musculoskeletal:     Right lower leg: No edema.     Left lower leg: No edema.  Skin:    General: Skin is warm and dry.  Neurological:     General: No focal deficit present.     Mental Status: She is alert.     Deep Tendon Reflexes:     Reflex Scores:      Patellar reflexes are 2+ on the right side and 2+ on the left side.    Comments: No clonus  Psychiatric:        Mood and Affect: Mood normal.        Behavior: Behavior normal.    MAU Course  Procedures Results for orders placed or performed during the hospital encounter of 10/02/21 (from the past 24 hour(s))  Comprehensive metabolic panel     Status: Abnormal   Collection Time: 10/02/21 11:02 PM  Result Value Ref Range   Sodium 138 135 - 145 mmol/L   Potassium 3.1 (L) 3.5 - 5.1 mmol/L   Chloride 106 98 - 111 mmol/L   CO2 24 22  - 32 mmol/L   Glucose, Bld 139 (H) 70 - 99 mg/dL   BUN 21 (H) 6 - 20 mg/dL   Creatinine, Ser 0.79 0.44 - 1.00 mg/dL   Calcium 9.0 8.9 - 10.3 mg/dL   Total Protein 6.6 6.5 - 8.1 g/dL   Albumin 3.1 (L) 3.5 - 5.0 g/dL   AST 24 15 - 41 U/L   ALT 26 0 - 44 U/L   Alkaline Phosphatase 97 38 - 126 U/L   Total Bilirubin 0.6 0.3 - 1.2 mg/dL   GFR, Estimated >60 >60 mL/min   Anion gap 8 5 - 15  CBC     Status: None   Collection Time: 10/02/21 11:02 PM  Result Value Ref Range   WBC 9.8 4.0 - 10.5 K/uL   RBC 4.29 3.87 - 5.11 MIL/uL   Hemoglobin 13.2 12.0 - 15.0 g/dL   HCT 39.5 36.0 - 46.0 %   MCV 92.1 80.0 - 100.0 fL   MCH 30.8 26.0 - 34.0 pg   MCHC 33.4 30.0 - 36.0 g/dL   RDW 13.5 11.5 - 15.5 %   Platelets 355 150 - 400 K/uL   nRBC 0.0 0.0 - 0.2 %    MDM Patient presents with pp hypertension. Received PP mag in the hospital prior to discharge. She is asymptomatic. Had 1 severe range BP. Did not require additional antihypertensives while in MAU. Preeclampsia labs normal.  Dr. Terri Piedra given patient update. Patient has f/u in the office tomorrow.   Assessment and Plan   1. Postpartum hypertension    -reviewed reasons to return to MAU -per Dr. Terri Piedra, take evening procardia dose at 6 pm & don't check BP until at least 830 pm -f/u with office tomorrow  Jorje Guild 10/03/2021, 12:37 AM

## 2021-10-02 NOTE — MAU Note (Addendum)
Tracey Nielsen is a 46 y.o. at [facility] day 5 here in MAU reporting: HTN starting yesterday. No other symptoms. Taking nifedipine 60mg  in morning and added 30mg  at night PP delivery on 09/27/21 Onset of complaint: 10/01/21 Pain score: 0/10 Vitals:   10/02/21 2204  BP: (!) 153/93  Pulse: 88  Resp: 16  Temp: 98.4 F (36.9 C)  SpO2: 100%     Lab orders placed from triage:

## 2021-10-11 ENCOUNTER — Telehealth (HOSPITAL_COMMUNITY): Payer: Self-pay

## 2021-10-11 NOTE — Telephone Encounter (Signed)
"  I'm feeling good, doing well." Patient declines questions or concerns about her healing.  "She's pretty good. I'm latching her and then supplementing afterwards. She drinks 1-3 ounces of supplement per feeding. She is gassy and always seems hungry and never full. She spits up. I have an appointment tomorrow with a lactation consultant at my pediatrician office. She has her nights and days mixed up still. She sleeps in a bassinet."  RN encouraged patient to keep appointment with LC. RN reviewed ABC's of safe sleep with patient. Patient declines any questions or concerns about baby.  EPDS score is 1.  Marcelino Duster [facility] 10/11/2021,1813

## 2022-01-11 IMAGING — US US MFM OB DETAIL+14 WK
1 series · 12 of 28 positions shown · non-contrast
Comparison: none

[Series 1: us mfm ob detail+14 wk · 76 acquisitions, 12 frames shown]
[im 3/76]
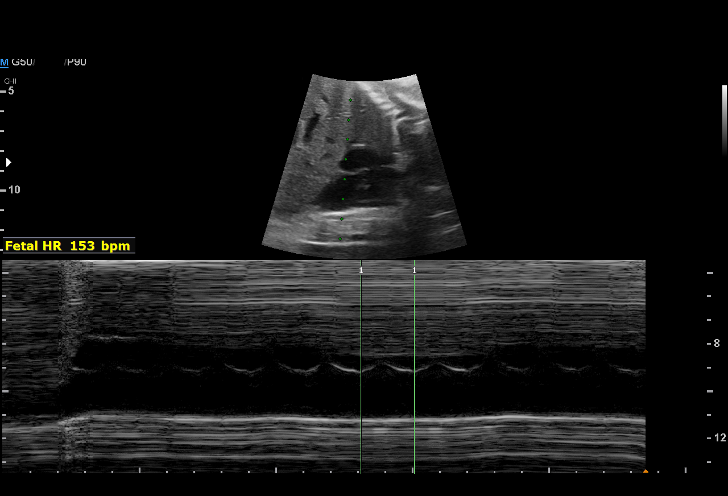
[im 9/76]
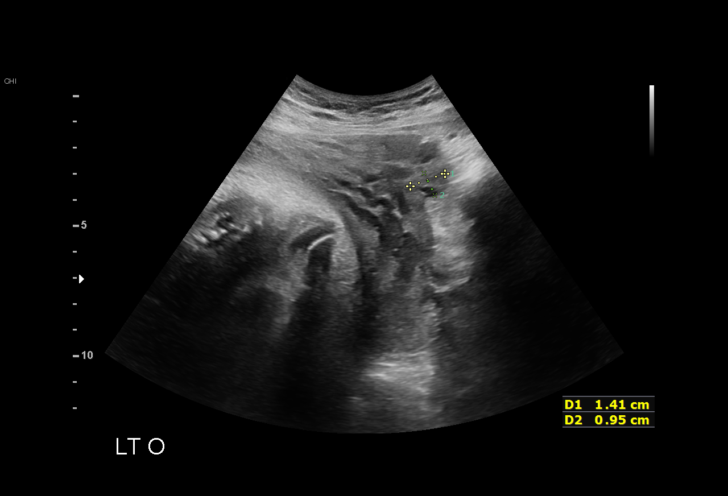
[im 14/76]
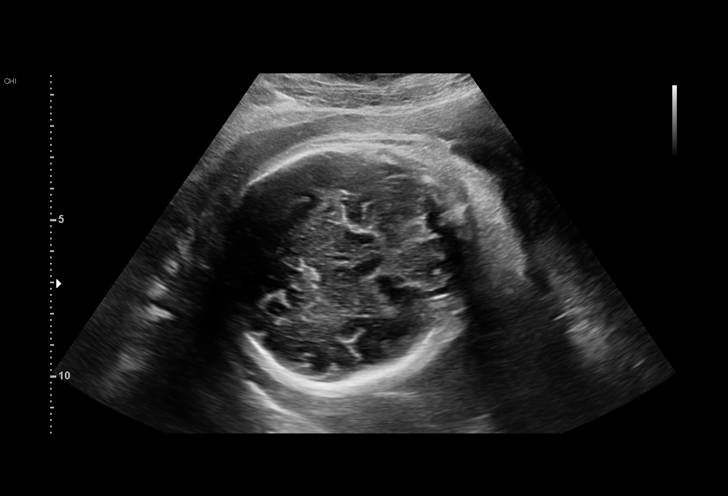
[im 23/76]
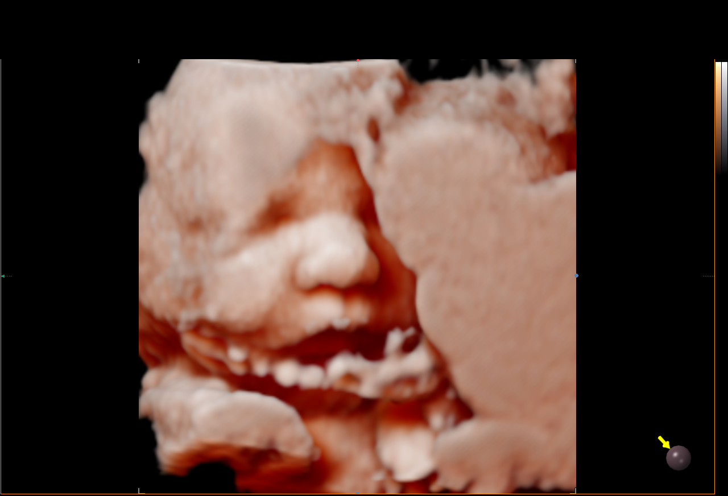
[im 28/76]
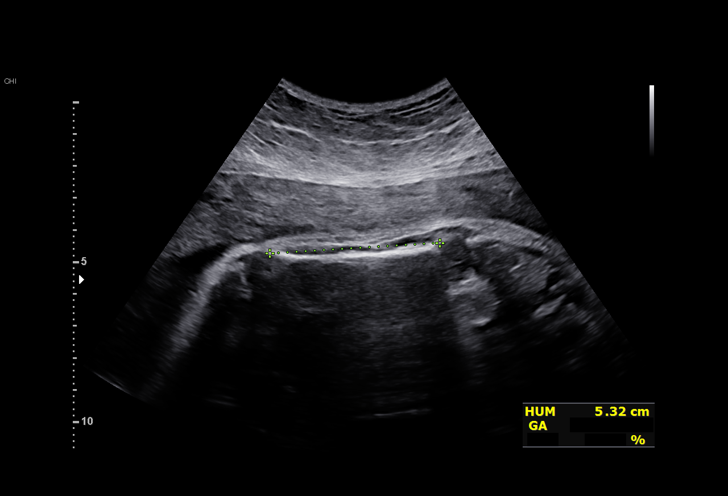
[im 34/76]
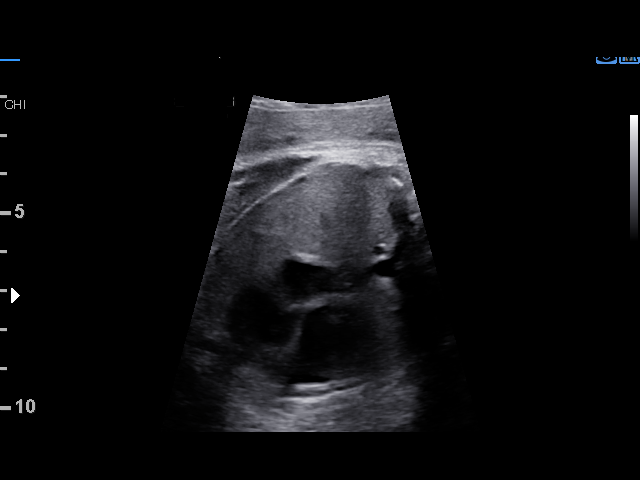
[im 42/76]
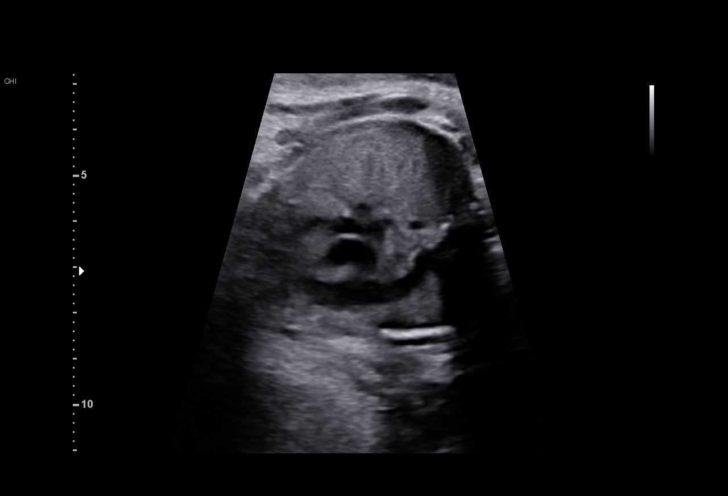
[im 48/76]
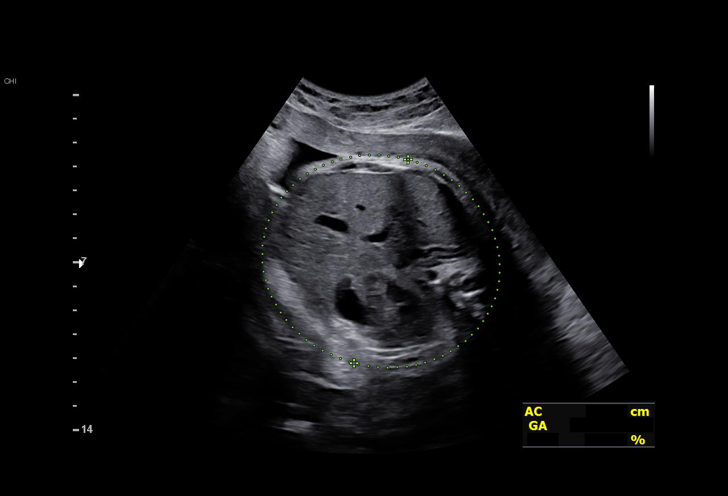
[im 53/76]
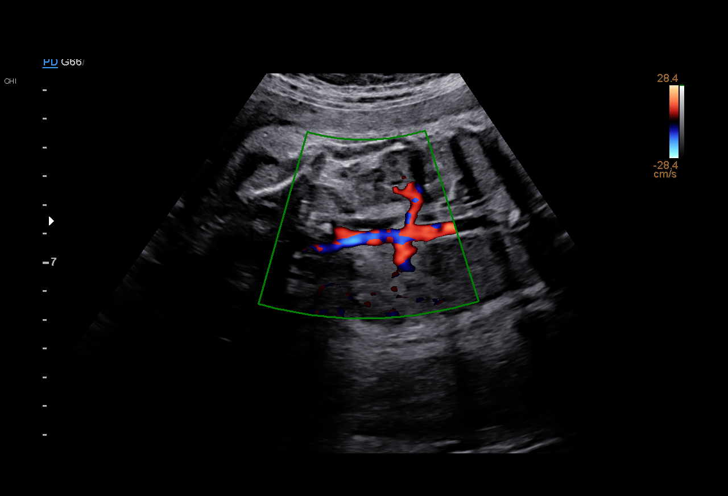
[im 62/76]
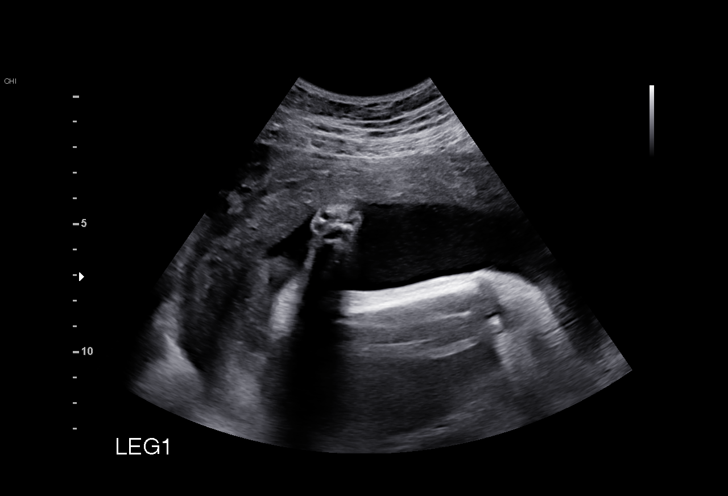
[im 67/76]
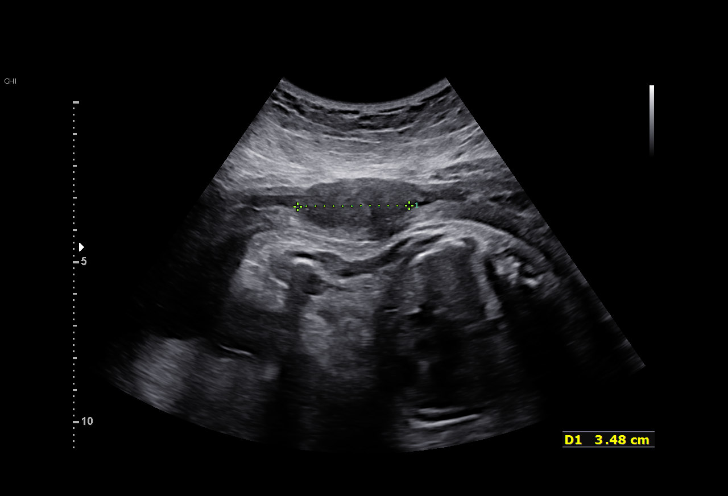
[im 73/76]
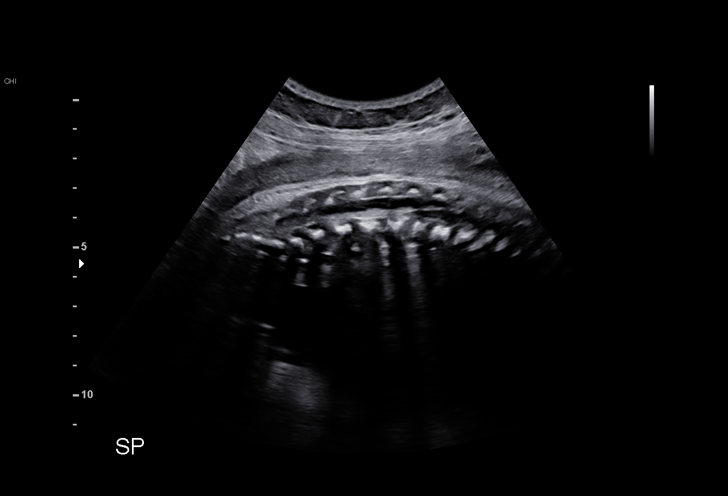

[12 of 28 positions shown; findings below may reference images not displayed]

#101

Indications

 Poor obstetrical history - history of T18
 Advanced maternal age multigravida 35+,
 third trimester (45)
 Hypothyroid - on synthroid
 Gestational diabetes in pregnancy,
 controlled by oral hypoglycemic drugs
 (metformin)
 31 weeks gestation of pregnancy
 Uterine fibroids
Fetal Evaluation

 Num Of Fetuses:         1
 Fetal Heart Rate(bpm):  153
 Cardiac Activity:       Observed
 Presentation:           Cephalic
 Placenta:               Posterior
 P. Cord Insertion:      Not well visualized

 Amniotic Fluid
 AFI FV:      Within normal limits

 AFI Sum(cm)     %Tile       Largest Pocket(cm)
 11.45           27

 RUQ(cm)       RLQ(cm)       LUQ(cm)        LLQ(cm)

Biometry
 BPD:      74.5  mm     G. Age:  29w 6d        4.9  %    CI:        70.62   %    70 - 86
                                                         FL/HC:      21.7   %    19.1 -
 HC:      282.6  mm     G. Age:  31w 0d          7  %    HC/AC:      0.96        0.96 -
 AC:      293.9  mm     G. Age:  33w 3d         91  %    FL/BPD:     82.3   %    71 - 87
 FL:       61.3  mm     G. Age:  31w 6d         43  %    FL/AC:      20.9   %    20 - 24
 HUM:        53  mm     G. Age:  30w 6d         37  %
 CER:      39.4  mm     G. Age:  32w 0d         39  %
 LV:        3.3  mm
 CM:        4.9  mm
 Est. FW:    7539  gm      4 lb 5 oz     65  %
OB History

 Gravidity:    6         Term:   2         SAB:   2
 Ectopic:      1        Living:  2
Gestational Age

 LMP:           31w 4d        Date:  01/14/21                 EDD:   10/21/21
 U/S Today:     31w 4d                                        EDD:   10/21/21
 Best:          31w 4d     Det. By:  LMP  (01/14/21)          EDD:   10/21/21
Anatomy

 Cranium:               Appears normal         Aortic Arch:            Not well visualized
 Cavum:                 Appears normal         Ductal Arch:            Not well visualized
 Ventricles:            Appears normal         Diaphragm:              Appears normal
 Choroid Plexus:        Appears normal         Stomach:                Appears normal, left
                                                                       sided
 Cerebellum:            Appears normal         Abdomen:                Appears normal
 Posterior Fossa:       Appears normal         Abdominal Wall:         Appears nml (cord
                                                                       insert, abd wall)
 Nuchal Fold:           Not applicable (>20    Cord Vessels:           Appears normal (3
                        wks GA)                                        vessel cord)
 Face:                  Appears normal         Kidneys:                Appear normal
                        (orbits and profile)
 Lips:                  Appears normal         Bladder:                Appears normal
 Thoracic:              Appears normal         Spine:                  Ltd views no
                                                                       intracranial signs of
                                                                       NTD
 Heart:                 Not well visualized    Upper Extremities:      Visualized
 RVOT:                  Appears normal         Lower Extremities:      Visualized
 LVOT:                  Not well visualized

 Other:  Fetus appears to be female. VC, 3VV visualized.
Cervix Uterus Adnexa

 Cervix
 Not visualized (advanced GA >26wks)

 Right Ovary
 Visualized.
 Left Ovary
 Visualized.
Myomas

 Site                     L(cm)      W(cm)      D(cm)       Location
 Anterior
 Left

 Blood Flow                  RI       PI       Comments

Impression

 G7 P2.  Patient is here for a second opinion.  At your office
 ultrasound, BPD and head circumference measurements
 were less than the 5th percentile.
 Advanced maternal age.  This is natural conception.  On cell
 free fetal DNA screening, the risks of fetal aneuploidies are
 not increased.
 Patient has gestational diabetes and takes metformin.  She
 reports her fasting levels are around 115 mg/dL and
 postprandial levels are within normal range.
 She has hypothyroidism and takes levothyroxine 100 mcg
 daily.

 Obstetric history significant for 2 term vaginal deliveries (in
 6772 and 8552).  Both her children are in good health.
 Patient reports she had 2 pregnancies complicated by
 trisomy 18 one of them at 17 weeks gestation.  The couple
 met with our genetic counselor after ultrasound and you will
 be receiving a separate letter.

 Fetal growth is appropriate for gestational age.  Head
 circumference measurement is at between -1 SD and mean
 (normal).  Amniotic fluid is normal good fetal activity seen.
 Fetal anatomical survey appears normal but limited by
 advanced gestational age.  Intracranial structures appear
 normal.
 2 small intramural myomas are seen (measurements above).

 I reassured the couple of normal head circumference
 measurements that it is not consistent with microcephaly.

 I discussed the importance of good blood glucose control to
 prevent adverse fetal or neonatal outcomes.  If fasting blood
 glucose is not well controlled with metformin, she may require
 insulin.
Recommendations

 - Follow-up scans may be performed at your office.
 -Weekly BPP from 32 weeks gestation till delivery.
                 Teoma, Aldo Martin

## 2022-03-27 ENCOUNTER — Ambulatory Visit: Payer: Medicaid Other | Admitting: Family Medicine

## 2022-07-19 ENCOUNTER — Ambulatory Visit
Admission: EM | Admit: 2022-07-19 | Discharge: 2022-07-19 | Disposition: A | Discharge status: Home / Self Care | Payer: Medicaid Other | Attending: Emergency Medicine | Admitting: Emergency Medicine

## 2022-07-19 ENCOUNTER — Encounter: Payer: Self-pay | Admitting: Emergency Medicine

## 2022-07-19 ENCOUNTER — Ambulatory Visit (INDEPENDENT_AMBULATORY_CARE_PROVIDER_SITE_OTHER): Payer: Medicaid Other

## 2022-07-19 DIAGNOSIS — B9689 Other specified bacterial agents as the cause of diseases classified elsewhere: Secondary | ICD-10-CM | POA: Diagnosis not present

## 2022-07-19 DIAGNOSIS — J449 Chronic obstructive pulmonary disease, unspecified: Secondary | ICD-10-CM | POA: Diagnosis not present

## 2022-07-19 DIAGNOSIS — R069 Unspecified abnormalities of breathing: Secondary | ICD-10-CM

## 2022-07-19 DIAGNOSIS — R059 Cough, unspecified: Secondary | ICD-10-CM

## 2022-07-19 DIAGNOSIS — J989 Respiratory disorder, unspecified: Secondary | ICD-10-CM | POA: Diagnosis not present

## 2022-07-19 DIAGNOSIS — J22 Unspecified acute lower respiratory infection: Secondary | ICD-10-CM | POA: Diagnosis not present

## 2022-07-19 MED ORDER — CEFDINIR 300 MG PO CAPS
300.0000 mg | ORAL_CAPSULE | Freq: Two times a day (BID) | ORAL | 0 refills | Status: AC
Start: 2022-07-19 — End: 2022-07-24

## 2022-07-19 MED ORDER — GUAIFENESIN 400 MG PO TABS: ORAL_TABLET | ORAL | 0 refills | Status: DC

## 2022-07-19 MED ORDER — AEROCHAMBER PLUS FLO-VU LARGE MISC: 1.0000 | Freq: Once | 0 refills | Status: AC

## 2022-07-19 MED ORDER — IBUPROFEN 400 MG PO TABS: 400.0000 mg | ORAL_TABLET | Freq: Three times a day (TID) | ORAL | 0 refills | Status: DC | PRN

## 2022-07-19 MED ORDER — XOPENEX HFA 45 MCG/ACT IN AERO: 2.0000 | INHALATION_SPRAY | Freq: Four times a day (QID) | RESPIRATORY_TRACT | 0 refills | Status: DC | PRN

## 2022-07-19 MED ORDER — AZITHROMYCIN 250 MG PO TABS: ORAL_TABLET | ORAL | 0 refills | Status: AC

## 2022-07-19 NOTE — Discharge Instructions (Addendum)
As we discussed, the radiology report of your chest x-ray did not reveal any acute signs of infection in your lung.  As we discussed, per my personal read of your x-ray, I do believe that your x-ray shows signs worrisome for pneumonia developing in your right middle lobe.  My physical exam findings correlate with what I am seeing on your x-ray.  Please read below to learn more about the medications, dosages and frequencies that I recommend to help alleviate your symptoms and to get you feeling better soon:  Dual antibiotic therapy is recommended by the American Thoracic Society and Infectious Diseases Society of [place].  Omnicef (cefdinir): Please take 1 capsule twice daily for 10 days, you can take it with or without food.  This antibiotic can cause upset stomach, this will resolve once antibiotics are complete.  You are welcome to use a probiotic, eat yogurt, take Imodium while taking this medication.  Please avoid other systemic medications such as Maalox, Pepto-Bismol or milk of magnesia as they can interfere with your body's ability to absorb the antibiotics.  Z-Pak (azithromycin): Please take take 2 tablets the first day, then take 1 tablet daily every day thereafter until complete.  Pro-Air, Ventolin, Proventil (albuterol): This inhaled medication contains a short acting beta agonist bronchodilator.  This medication works on the smooth muscle that opens and constricts of your airways by relaxing the muscle.  The result of relaxation of the smooth muscle is increased air movement and improved work of breathing.  This is a short acting medication that can be used every 4-6 hours as needed for increased work of breathing, shortness of breath, wheezing and excessive coughing.  I have provided you with a prescription of a spacer as well, please asked the pharmacist to demonstrate its use.    Advil, Motrin (ibuprofen): This is a good anti-inflammatory medication which not only addresses aches, pains  but also significantly reduces soft tissue inflammation of the upper airways that causes sinus and nasal congestion as well as inflammation of the lower airways which makes you feel like your breathing is constricted or your cough feel tight.  I recommend that you take 400 mg every 8 hours as needed.     Robitussin, Mucinex (guaifenesin): This is an expectorant.  This helps break up chest congestion and loosen up thick nasal drainage making phlegm and drainage more liquid and therefore easier to remove.  I recommend being 400 mg three times daily as needed.        Please follow-up within the next 5-7 days either with your primary care provider or urgent care if your symptoms do not resolve.  If you do not have a primary care provider, we will assist you in finding one.        Thank you for visiting urgent care today.  We appreciate the opportunity to participate in your care.

## 2022-07-19 NOTE — ED Provider Notes (Signed)
[facility]    CSN: [id number] Arrival date & time: 07/19/22  1035    HISTORY   Chief Complaint  Patient presents with   URI    Entered by patient   HPI Tracey Nielsen is a pleasant, 46 y.o. female who presents to urgent care today. Patient c/o URI x 1 week, head and chest congestion, productive cough.  Patient has taken Delsym and cough drops.  Reports recent travel to [place] to visit family members, states 26-year-old nephew was sick with respiratory illness during their visit.  The history is provided by the patient.  URI Presenting symptoms: congestion, cough, fatigue, rhinorrhea and sore throat   Congestion:    Location:  Nasal   Interferes with sleep: no     Interferes with eating/drinking: no   Cough:    Cough characteristics:  Productive   Sputum characteristics:  Nondescript   Severity:  Moderate   Onset quality:  Sudden   Duration:  7 days   Timing:  Constant   Progression:  Unchanged   Chronicity:  New Fatigue:    Timing:  Intermittent Rhinorrhea:    Quality:  Clear   Severity:  Mild   Progression:  Unchanged Severity:  Moderate Onset quality:  Sudden Duration:  7 days Timing:  Constant Progression:  Improving Chronicity:  New Relieved by:  Nothing Worsened by:  Nothing Ineffective treatments: Cough drops, Delsym. Risk factors: recent travel and sick contacts   Risk factors: not elderly, no chronic cardiac disease, no chronic kidney disease, no chronic respiratory disease, no diabetes mellitus, no immunosuppression and no recent illness    Past Medical History:  Diagnosis Date   Gestational diabetes    Hypothyroidism    Pregnancy induced hypertension    Patient Active Problem List   Diagnosis Date Noted   Preeclampsia, severe 09/27/2021   Gestational diabetes mellitus in childbirth, insulin controlled 09/27/2021   SVD (spontaneous vaginal delivery) 09/27/2021   Past Surgical History:  Procedure Laterality Date   APPENDECTOMY      WISDOM TOOTH EXTRACTION     OB History     Gravida  9   Para  3   Term  2   Preterm  1   AB  6   Living  3      SAB  5   IAB      Ectopic  1   Multiple  0   Live Births  1          Home Medications    Prior to Admission medications   Medication Sig Start Date End Date Taking? Authorizing Provider  levothyroxine (SYNTHROID) 100 MCG tablet Take 100 mcg by mouth daily before breakfast.   Yes [provider]  liothyronine (CYTOMEL) 5 MCG tablet Take 5 mcg by mouth daily.   Yes [provider]  ibuprofen (ADVIL) 600 MG tablet Take 1 tablet (600 mg total) by mouth every 6 (six) hours as needed. 09/30/21   Vanessa Kick, MD  ibuprofen (ADVIL) 600 MG tablet Take 1 tablet (600 mg total) by mouth every 6 (six) hours as needed. 09/30/21   Vanessa Kick, MD  NIFEdipine (ADALAT CC) 30 MG 24 hr tablet Take 30 mg by mouth at bedtime.    [provider]  NIFEdipine (ADALAT CC) 60 MG 24 hr tablet Take 1 tablet (60 mg total) by mouth daily. 10/01/21   Vanessa Kick, MD    Family History Family History  Problem Relation Age of Onset  Heart disease Mother    Cancer Maternal Aunt    Social History Social History   Tobacco Use   Smoking status: Former    Types: Cigarettes    Quit date: 03/14/2015    Years since quitting: 7.3  Vaping Use   Vaping Use: Never used  Substance Use Topics   Alcohol use: No   Drug use: No   Allergies   Amoxicillin  Review of Systems Review of Systems  Constitutional:  Positive for fatigue.  HENT:  Positive for congestion, rhinorrhea and sore throat.   Respiratory:  Positive for cough.    Pertinent findings revealed after performing a 14 point review of systems has been noted in the history of present illness.  Physical Exam Triage Vital Signs ED Triage Vitals  Enc Vitals Group     BP 07/22/21 0827 (!) 147/82     Pulse Rate 07/22/21 0827 72     Resp 07/22/21 0827 18     Temp 07/22/21 0827 98.3 F (36.8 C)      Temp Source 07/22/21 0827 Oral     SpO2 07/22/21 0827 98 %     Weight --      Height --      Head Circumference --      Peak Flow --      Pain Score 07/22/21 0826 5     Pain Loc --      Pain Edu? --      Excl. in [place]? --   No data found.  Updated Vital Signs BP 134/88 (BP Location: Right Arm)   Pulse 67   Temp 98 F (36.7 C) (Oral)   Resp 18   Ht 5\' 5"  (1.651 m)   Wt 175 lb (79.4 kg)   LMP 07/13/2022   SpO2 95%   Breastfeeding Yes   BMI 29.12 kg/m   Physical Exam Vitals and nursing note reviewed.  Constitutional:      General: She is not in acute distress.    Appearance: Normal appearance. She is not ill-appearing.  HENT:     Head: Normocephalic and atraumatic.     Salivary Glands: Right salivary gland is not diffusely enlarged or tender. Left salivary gland is not diffusely enlarged or tender.     Right Ear: Hearing, ear canal and external ear normal. No drainage. No middle ear effusion. There is no impacted cerumen. Tympanic membrane is erythematous. Tympanic membrane is not injected, retracted or bulging.     Left Ear: Hearing, ear canal and external ear normal. No drainage.  No middle ear effusion. There is no impacted cerumen. Tympanic membrane is erythematous. Tympanic membrane is not injected, retracted or bulging.     Nose: Mucosal edema and rhinorrhea present. No nasal deformity, septal deviation or congestion. Rhinorrhea is clear.     Right Turbinates: Not enlarged, swollen or pale.     Left Turbinates: Not enlarged, swollen or pale.     Right Sinus: No maxillary sinus tenderness or frontal sinus tenderness.     Left Sinus: No maxillary sinus tenderness or frontal sinus tenderness.     Mouth/Throat:     Lips: Pink. No lesions.     Mouth: Mucous membranes are moist. No oral lesions.     Pharynx: Oropharynx is clear. Uvula midline. No pharyngeal swelling, oropharyngeal exudate, posterior oropharyngeal erythema or uvula swelling.     Tonsils: No tonsillar exudate or  tonsillar abscesses. 0 on the right. 0 on the left.  Eyes:  General: Lids are normal. Vision grossly intact. No allergic shiner.       Right eye: No discharge.        Left eye: No discharge.     Extraocular Movements: Extraocular movements intact.     Conjunctiva/sclera: Conjunctivae normal.     Right eye: Right conjunctiva is not injected. No exudate.    Left eye: Left conjunctiva is not injected. No exudate.    Pupils: Pupils are equal, round, and reactive to light.  Neck:     Thyroid: No thyroid mass, thyromegaly or thyroid tenderness.     Trachea: Trachea and phonation normal.  Cardiovascular:     Rate and Rhythm: Normal rate and regular rhythm.     Pulses: Normal pulses.     Heart sounds: Normal heart sounds. No murmur heard.    No friction rub. No gallop.  Pulmonary:     Effort: Pulmonary effort is normal. No tachypnea, bradypnea, accessory muscle usage, prolonged expiration, respiratory distress or retractions.     Breath sounds: No stridor, decreased air movement or transmitted upper airway sounds. Examination of the right-middle field reveals decreased breath sounds and rales. Examination of the right-lower field reveals decreased breath sounds and rales. Decreased breath sounds and rales present. No wheezing or rhonchi.  Chest:     Chest wall: No tenderness.  Musculoskeletal:        General: Normal range of motion.     Cervical back: Full passive range of motion without pain, normal range of motion and neck supple. Normal range of motion.  Lymphadenopathy:     Cervical: No cervical adenopathy.  Skin:    General: Skin is warm and dry.     Findings: No erythema or rash.  Neurological:     General: No focal deficit present.     Mental Status: She is alert and oriented to person, place, and time.  Psychiatric:        Mood and Affect: Mood normal.        Behavior: Behavior normal.     Visual Acuity Right Eye Distance:   Left Eye Distance:   Bilateral Distance:     Right Eye Near:   Left Eye Near:    Bilateral Near:     [facility] / Diagnostics / Procedures:     Radiology DG Chest 2 View  Result Date: 07/19/2022 CLINICAL DATA:  Productive cough for 1 week, abnormal breath sounds RIGHT lower lobe EXAM: CHEST - 2 VIEW COMPARISON:  None FINDINGS: Normal heart size, mediastinal contours, and pulmonary vascularity. Lungs clear. No pulmonary infiltrate, pleural effusion, or pneumothorax. Osseous structures unremarkable. IMPRESSION: No acute abnormalities. Electronically Signed   By: Lavonia Dana M.D.   On: 07/19/2022 12:08    Procedures Procedures (including critical care time) EKG  Pending results:  Labs Reviewed - No data to display  Medications Ordered in UC: Medications - No data to display  UC Diagnoses / Final Clinical Impressions(s)   I have reviewed the triage vital signs and the nursing notes.  Pertinent labs & imaging results that were available during my care of the patient were reviewed by me and considered in my medical decision making (see chart for details).    Final diagnoses:  Bacterial lower respiratory infection  Obstructive airway disease ([place])  Respiratory illness   Patient advised of radiologist findings.  Imaging shared with patient along with detailed expiration of why I do believe she requires antibiotics at this time.  I believe that she is  on the cusp of developing a bacterial pneumonia in her right middle lobe per my personal read of her chest x-ray and requires antibiotics at this time.  Patient provided with prescriptions for cefdinir and azithromycin.  Patient also provided with prescriptions for albuterol, ibuprofen and Mucinex.  We will defer prescribing sedating medications due to patient currently breast-feeding.  Return precautions advised.  ED Prescriptions     Medication Sig Dispense Auth. Provider   cefdinir (OMNICEF) 300 MG capsule Take 1 capsule (300 mg total) by mouth 2 (two) times daily for 5 days. 10  capsule Lynden Oxford Scales, PA-C   azithromycin (ZITHROMAX) 250 MG tablet Take 2 tablets (500 mg total) by mouth daily for 1 day, THEN 1 tablet (250 mg total) daily for 4 days. 6 tablet Lynden Oxford Scales, PA-C   levalbuterol [facility] HFA) 45 MCG/ACT inhaler Inhale 2 puffs into the lungs every 6 (six) hours as needed for wheezing or shortness of breath (cough). 45 g Lynden Oxford Scales, PA-C   Spacer/Aero-Holding Chambers (AEROCHAMBER PLUS FLO-VU LARGE) MISC 1 each by Other route once for 1 dose. 1 each Lynden Oxford Scales, PA-C   guaifenesin (HUMIBID E) 400 MG TABS tablet Take 1 tablet 3 times daily as needed for chest congestion and cough 21 tablet Lynden Oxford Scales, PA-C   ibuprofen (ADVIL) 400 MG tablet Take 1 tablet (400 mg total) by mouth every 8 (eight) hours as needed for up to 30 doses. 30 tablet Lynden Oxford Scales, PA-C      PDMP not reviewed this encounter.  Disposition Upon Discharge:  Condition: stable for discharge home Home: take medications as prescribed; routine discharge instructions as discussed; follow up as advised.  Patient presented with an acute illness with associated systemic symptoms and significant discomfort requiring urgent management. In my opinion, this is a condition that a prudent lay person (someone who possesses an average knowledge of health and medicine) may potentially expect to result in complications if not addressed urgently such as respiratory distress, impairment of bodily function or dysfunction of bodily organs.   Routine symptom specific, illness specific and/or disease specific instructions were discussed with the patient and/or caregiver at length.   As such, the patient has been evaluated and assessed, work-up was performed and treatment was provided in alignment with urgent care protocols and evidence based medicine.  Patient/parent/caregiver has been advised that the patient may require follow up for further testing and  treatment if the symptoms continue in spite of treatment, as clinically indicated and appropriate.  If the patient was tested for COVID-19, Influenza and/or RSV, then the patient/parent/guardian was advised to isolate at home pending the results of his/her diagnostic coronavirus test and potentially longer if they're positive. I have also advised pt that if his/her COVID-19 test returns positive, it's recommended to self-isolate for at least 10 days after symptoms first appeared AND until fever-free for 24 hours without fever reducer AND other symptoms have improved or resolved. Discussed self-isolation recommendations as well as instructions for household member/close contacts as per the [facility] and [place] DHHS, and also gave patient the [place] packet with this information.  Patient/parent/caregiver has been advised to return to the [facility] or PCP in 3-5 days if no better; to PCP or the Emergency Department if new signs and symptoms develop, or if the current signs or symptoms continue to change or worsen for further workup, evaluation and treatment as clinically indicated and appropriate  The patient will follow up with their current PCP if and  as advised. If the patient does not currently have a PCP we will assist them in obtaining one.   The patient may need specialty follow up if the symptoms continue, in spite of conservative treatment and management, for further workup, evaluation, consultation and treatment as clinically indicated and appropriate.  Patient/parent/caregiver verbalized understanding and agreement of plan as discussed.  All questions were addressed during visit.  Please see discharge instructions below for further details of plan.  Discharge Instructions:   Discharge Instructions      As we discussed, the radiology report of your chest x-ray did not reveal any acute signs of infection in your lung.  As we discussed, per my personal read of your x-ray, I do believe that your x-ray shows  signs worrisome for pneumonia developing in your right middle lobe.  My physical exam findings correlate with what I am seeing on your x-ray.  Please read below to learn more about the medications, dosages and frequencies that I recommend to help alleviate your symptoms and to get you feeling better soon:  Dual antibiotic therapy is recommended by the American Thoracic Society and Infectious Diseases Society of [place].  Omnicef (cefdinir): Please take 1 capsule twice daily for 10 days, you can take it with or without food.  This antibiotic can cause upset stomach, this will resolve once antibiotics are complete.  You are welcome to use a probiotic, eat yogurt, take Imodium while taking this medication.  Please avoid other systemic medications such as Maalox, Pepto-Bismol or milk of magnesia as they can interfere with your body's ability to absorb the antibiotics.  Z-Pak (azithromycin): Please take take 2 tablets the first day, then take 1 tablet daily every day thereafter until complete.  Pro-Air, Ventolin, Proventil (albuterol): This inhaled medication contains a short acting beta agonist bronchodilator.  This medication works on the smooth muscle that opens and constricts of your airways by relaxing the muscle.  The result of relaxation of the smooth muscle is increased air movement and improved work of breathing.  This is a short acting medication that can be used every 4-6 hours as needed for increased work of breathing, shortness of breath, wheezing and excessive coughing.  I have provided you with a prescription of a spacer as well, please asked the pharmacist to demonstrate its use.    Advil, Motrin (ibuprofen): This is a good anti-inflammatory medication which not only addresses aches, pains but also significantly reduces soft tissue inflammation of the upper airways that causes sinus and nasal congestion as well as inflammation of the lower airways which makes you feel like your breathing is  constricted or your cough feel tight.  I recommend that you take 400 mg every 8 hours as needed.     Robitussin, Mucinex (guaifenesin): This is an expectorant.  This helps break up chest congestion and loosen up thick nasal drainage making phlegm and drainage more liquid and therefore easier to remove.  I recommend being 400 mg three times daily as needed.        Please follow-up within the next 5-7 days either with your primary care provider or urgent care if your symptoms do not resolve.  If you do not have a primary care provider, we will assist you in finding one.        Thank you for visiting urgent care today.  We appreciate the opportunity to participate in your care.         This office note has been dictated using Dragon  speech recognition software.  Unfortunately, this method of dictation can sometimes lead to typographical or grammatical errors.  I apologize for your inconvenience in advance if this occurs.  Please do not hesitate to reach out to me if clarification is needed.      Lynden Oxford Scales, PA-C 07/19/22 1245

## 2022-07-19 NOTE — ED Triage Notes (Signed)
Patient c/o URI x 1 week, head and chest congestion, productive cough.  Patient has taken Delsym and cough drops.

## 2022-07-20 ENCOUNTER — Telehealth: Payer: Self-pay

## 2022-07-20 NOTE — Telephone Encounter (Signed)
Pharmacy called stating the levalbuterol is out of stock. New order (telephone order- given by provider on site today) ordered for albuterol-ventolin, 2 puffs (inhalation) Every 6 hours PRN for wheezing, shortness of breath, cough, Dispense Quantity: 54g.

## 2022-10-03 ENCOUNTER — Ambulatory Visit (INDEPENDENT_AMBULATORY_CARE_PROVIDER_SITE_OTHER): Payer: Medicaid Other

## 2022-10-03 ENCOUNTER — Ambulatory Visit (INDEPENDENT_AMBULATORY_CARE_PROVIDER_SITE_OTHER): Payer: Medicaid Other | Admitting: Family Medicine

## 2022-10-03 ENCOUNTER — Encounter (HOSPITAL_BASED_OUTPATIENT_CLINIC_OR_DEPARTMENT_OTHER): Payer: Self-pay | Admitting: Family Medicine

## 2022-10-03 VITALS — BP 124/64 | HR 65 | Ht 65.0 in | Wt 177.4 lb

## 2022-10-03 DIAGNOSIS — E063 Autoimmune thyroiditis: Secondary | ICD-10-CM

## 2022-10-03 DIAGNOSIS — R0602 Shortness of breath: Secondary | ICD-10-CM | POA: Insufficient documentation

## 2022-10-03 DIAGNOSIS — E038 Other specified hypothyroidism: Secondary | ICD-10-CM

## 2022-10-03 DIAGNOSIS — Z23 Encounter for immunization: Secondary | ICD-10-CM

## 2022-10-03 DIAGNOSIS — E039 Hypothyroidism, unspecified: Secondary | ICD-10-CM | POA: Insufficient documentation

## 2022-10-03 NOTE — Progress Notes (Signed)
New Patient Office Visit  Subjective    Patient ID: Tracey Nielsen, female    DOB: [birth year]  Age: 47 y.o. MRN: [id number]  CC:  Chief Complaint  Patient presents with   New Patient (Initial Visit)    Pt here to establish new care    HPI Tracey Nielsen presents to establish care Last PCP - in [place], [facility]  Recent respiratory infection in October 2023 - patient was seen at urgent care.  At that time, she did have chest x-ray completed and there was disagreement between her treating provider and radiologist interpretation of the chest x-ray.  She was started on antibiotics for pneumonia.  She did complete antibiotics as prescribed and she was also provided short acting inhaler which she utilized.  She indicates that she continues to have intermittent shortness of breath and has continued to need short acting levalbuterol inhaler daily in order to help with controlling symptoms.  As well, she does have about a 20-pack-year history of smoking.  She is not sure if her continued symptoms are normal related to prior history of pneumonia.  Former tobacco use, quit 2016, smoked about 20 years, about 1 ppd  Hashimoto's: Diagnosed in the past, she did have this manage more recently by her OB/GYN. Had levothyroxine dose increased in December due to elevated TSH.  She was due to have thyroid function rechecked at the end of this month to monitor progress with change in levothyroxine dose  GDM: during most recent pregnancy, child is now 70 years old.  She does follow with OB/GYN - [facility]. Had labs in December with them.  Patient is originally from [place]. She has lived here for 2 years. Patient works as Financial controller. Outside of work, she enjoys traveling, reading, walking.  Outpatient Encounter Medications as of 10/03/2022  Medication Sig   levalbuterol (XOPENEX HFA) 45 MCG/ACT inhaler Inhale 2 puffs into the lungs every 6 (six) hours as needed for  wheezing or shortness of breath (cough).   levothyroxine (SYNTHROID) 100 MCG tablet Take 100 mcg by mouth daily before breakfast.   liothyronine (CYTOMEL) 5 MCG tablet Take 5 mcg by mouth daily.   [DISCONTINUED] guaifenesin (HUMIBID E) 400 MG TABS tablet Take 1 tablet 3 times daily as needed for chest congestion and cough   [DISCONTINUED] ibuprofen (ADVIL) 400 MG tablet Take 1 tablet (400 mg total) by mouth every 8 (eight) hours as needed for up to 30 doses.   [DISCONTINUED] NIFEdipine (ADALAT CC) 30 MG 24 hr tablet Take 30 mg by mouth at bedtime.   [DISCONTINUED] NIFEdipine (ADALAT CC) 60 MG 24 hr tablet Take 1 tablet (60 mg total) by mouth daily.   No facility-administered encounter medications on file as of 10/03/2022.    Past Medical History:  Diagnosis Date   Gestational diabetes    Hypothyroidism    Pregnancy induced hypertension     Past Surgical History:  Procedure Laterality Date   APPENDECTOMY     WISDOM TOOTH EXTRACTION      Family History  Problem Relation Age of Onset   Heart disease Mother    Cancer Maternal Aunt     Social History   Socioeconomic History   Marital status: Single    Spouse name: Not on file   Number of children: Not on file   Years of education: Not on file   Highest education level: Not on file  Occupational History   Not on file  Tobacco Use   Smoking  status: Former    Types: Cigarettes    Quit date: 03/14/2015    Years since quitting: 7.5   Smokeless tobacco: Not on file  Vaping Use   Vaping Use: Never used  Substance and Sexual Activity   Alcohol use: No   Drug use: No   Sexual activity: Yes  Other Topics Concern   Not on file  Social History Narrative   Not on file   Social Determinants of Health   Financial Resource Strain: Not on file  Food Insecurity: No Food Insecurity (08/10/2021)   Hunger Vital Sign    Worried About Running Out of Food in the Last Year: Never true    Ran Out of Food in the Last Year: Never true   Transportation Needs: Not on file  Physical Activity: Not on file  Stress: Not on file  Social Connections: Not on file  Intimate Partner Violence: Not on file    Objective    BP 124/64 (BP Location: Right Arm, Patient Position: Sitting, Cuff Size: Large)   Pulse 65   Ht 5\' 5"  (1.651 m)   Wt 177 lb 6.4 oz (80.5 kg)   SpO2 100%   BMI 29.52 kg/m   Physical Exam  47 year old female in no acute distress Cardiovascular exam.  The rate and rhythm, no murmur appreciated Lungs with intermittent expiratory wheezing, no rhonchi or crackles.  Assessment & Plan:   Problem List Items Addressed This Visit       Endocrine   Hypothyroidism - Primary    For now, can continue with current regimen of levothyroxine and liothyronine with no dose changes to be made today.  We will plan to recheck heart function labs towards the end of the month to assess progress with new dose of levothyroxine.  Further dose changes according to lab results      Relevant Orders   TSH Rfx on Abnormal to Free T4     Other   Shortness of breath    Uncertain etiology.  She does have some wheezing on exam today.  Uncertain whether current symptoms may be related to chronic pulmonary changes related to history of tobacco use or recent respiratory infection which was diagnosed as pneumonia.  She did have appropriate antibiotic therapy that was completed.  Given patient history, we will proceed with chest x-ray today to ensure no residual intrapulmonary process.  We will also proceed with referral to pulmonology for further evaluation and management, particularly if chest x-ray is clear.  In the meantime, she can continue with the use of short acting inhaler to aid in controlling symptoms      Relevant Orders   DG Chest 2 View   Ambulatory referral to Pulmonology   Other Visit Diagnoses     Need for influenza vaccination       Relevant Orders   Flu Vaccine QUAD 6+ mos PF IM (Fluarix Quad PF) (Completed)      Discussed lung cancer screening guidelines and recommendations.  Discussed that general age which to initiate screening is typically between 61 to 32 years old, depending upon which Society guidelines are used  Return in about 2 months (around 12/02/2022) for hypothyroidism, pulm referral.   Hutch Rhett J [place], MD

## 2022-10-03 NOTE — Assessment & Plan Note (Signed)
For now, can continue with current regimen of levothyroxine and liothyronine with no dose changes to be made today.  We will plan to recheck heart function labs towards the end of the month to assess progress with new dose of levothyroxine.  Further dose changes according to lab results

## 2022-10-03 NOTE — Assessment & Plan Note (Signed)
Uncertain etiology.  She does have some wheezing on exam today.  Uncertain whether current symptoms may be related to chronic pulmonary changes related to history of tobacco use or recent respiratory infection which was diagnosed as pneumonia.  She did have appropriate antibiotic therapy that was completed.  Given patient history, we will proceed with chest x-ray today to ensure no residual intrapulmonary process.  We will also proceed with referral to pulmonology for further evaluation and management, particularly if chest x-ray is clear.  In the meantime, she can continue with the use of short acting inhaler to aid in controlling symptoms

## 2022-10-03 NOTE — Patient Instructions (Signed)
  Medication Instructions:  Your physician recommends that you continue on your current medications as directed. Please refer to the Current Medication list given to you today. --If you need a refill on any your medications before your next appointment, please call your pharmacy first. If no refills are authorized on file call the office.-- Lab Work: Your physician has recommended that you have lab work today: No If you have labs (blood work) drawn today and your tests are completely normal, you will receive your results via [place] a phone call from our staff.  Please ensure you check your voicemail in the event that you authorized detailed messages to be left on a delegated number. If you have any lab test that is abnormal or we need to change your treatment, we will call you to review the results.  Referrals/Procedures/Imaging: Yes  Follow-Up: Your next appointment:   Your physician recommends that you schedule a follow-up appointment for physical with Dr. de [place].  You will receive a text message or e-mail with a link to a survey about your care and experience with [place] today! We would greatly appreciate your feedback!   Thanks for letting us be apart of your health journey!!  Primary Care and Sports Medicine   Dr. Arlina Robes [place]   We encourage you to activate your patient portal called "MyChart".  Sign up information is provided on this After Visit Summary.  MyChart is used to connect with patients for Virtual Visits (Telemedicine).  Patients are able to view lab/test results, encounter notes, upcoming appointments, etc.  Non-urgent messages can be sent to your provider as well. To learn more about what you can do with MyChart, please visit --  [email or web address].

## 2022-10-13 ENCOUNTER — Ambulatory Visit (HOSPITAL_BASED_OUTPATIENT_CLINIC_OR_DEPARTMENT_OTHER): Payer: Medicaid Other

## 2022-10-13 DIAGNOSIS — E038 Other specified hypothyroidism: Secondary | ICD-10-CM

## 2022-10-14 LAB — TSH RFX ON ABNORMAL TO FREE T4: TSH: 0.176 u[IU]/mL — ABNORMAL LOW (ref 0.450–4.500)

## 2022-10-14 LAB — T4F: T4,Free (Direct): 1.12 ng/dL (ref 0.82–1.77)

## 2022-10-16 ENCOUNTER — Other Ambulatory Visit (HOSPITAL_BASED_OUTPATIENT_CLINIC_OR_DEPARTMENT_OTHER): Payer: Self-pay | Admitting: Family Medicine

## 2022-10-16 MED ORDER — LEVOTHYROXINE SODIUM 88 MCG PO TABS: 88.0000 ug | ORAL_TABLET | Freq: Every day | ORAL | 1 refills | Status: DC

## 2022-10-18 ENCOUNTER — Ambulatory Visit (HOSPITAL_BASED_OUTPATIENT_CLINIC_OR_DEPARTMENT_OTHER): Payer: Medicaid Other

## 2022-10-29 ENCOUNTER — Ambulatory Visit
Admission: EM | Admit: 2022-10-29 | Discharge: 2022-10-29 | Disposition: A | Discharge status: Home / Self Care | Payer: Medicaid Other

## 2022-10-29 ENCOUNTER — Ambulatory Visit: Payer: Medicaid Other

## 2022-10-29 DIAGNOSIS — R062 Wheezing: Secondary | ICD-10-CM

## 2022-10-29 DIAGNOSIS — J309 Allergic rhinitis, unspecified: Secondary | ICD-10-CM

## 2022-10-29 DIAGNOSIS — J0191 Acute recurrent sinusitis, unspecified: Secondary | ICD-10-CM | POA: Diagnosis not present

## 2022-10-29 MED ORDER — PREDNISONE 20 MG PO TABS: ORAL_TABLET | ORAL | 0 refills | Status: DC

## 2022-10-29 MED ORDER — CETIRIZINE HCL 10 MG PO TABS: 10.0000 mg | ORAL_TABLET | Freq: Every day | ORAL | 0 refills | Status: AC

## 2022-10-29 MED ORDER — CEFDINIR 300 MG PO CAPS
300.0000 mg | ORAL_CAPSULE | Freq: Two times a day (BID) | ORAL | 0 refills | Status: DC
Start: 2022-10-29 — End: 2022-12-06

## 2022-10-29 NOTE — Discharge Instructions (Signed)
Start prednisone today for 5 days. Wait a minimum of 4 hours after taking the medication to breastfeed. Start Zyrtec daily for what I suspect is underlying rhinitis. Once you clear this infection in about 2 weeks, start Flonase 2 sprays to each nostril daily. Use your inhaler as needed.

## 2022-10-29 NOTE — ED Triage Notes (Signed)
Pt reports cough x 1 week. Delsym gives no relief. Pt is breastfeeding.

## 2022-10-29 NOTE — ED Provider Notes (Signed)
[facility]  Note:  This document was prepared using Systems analyst and may include unintentional dictation errors.  MRN: [id number] DOB: [birth year]  Subjective:   Tracey Nielsen is a 47 y.o. female presenting for 1 week history of acute onset persistent coughing, chest pain, shob, wheezing, nasal congestion. No history of asthma. Has had difficulty with her breathing, is seeing a pulmonologist on Tuesday.  Patient is actually had difficulty with her breathing including shortness of breath and wheezing since October.  She was initially seen then and treated empirically for pneumonia despite a negative chest x-ray.  She completed a course of azithromycin and cefdinir without any issues.  She has been using her lev albuterol with very temporary relief.  She subsequently saw her PCP a month ago and had another chest x-ray.  It was also negative.  Patient is not a smoker, no marijuana, no vaping.  Patient is breast-feeding.  No current facility-administered medications for this encounter.  Current Outpatient Medications:    Dextromethorphan-Menthol (DELSYM COUGH RELIEF MT), Use as directed in the mouth or throat., Disp: , Rfl:    levalbuterol (XOPENEX HFA) 45 MCG/ACT inhaler, Inhale 2 puffs into the lungs every 6 (six) hours as needed for wheezing or shortness of breath (cough)., Disp: 45 g, Rfl: 0   levothyroxine (SYNTHROID) 88 MCG tablet, Take 1 tablet (88 mcg total) by mouth daily before breakfast., Disp: 30 tablet, Rfl: 1   liothyronine (CYTOMEL) 5 MCG tablet, Take 5 mcg by mouth daily., Disp: , Rfl:    Allergies  Allergen Reactions   Amoxicillin Rash    No cross-reaction with penicillin    Past Medical History:  Diagnosis Date   Gestational diabetes    Hypothyroidism    Pregnancy induced hypertension      Past Surgical History:  Procedure Laterality Date   APPENDECTOMY     WISDOM TOOTH EXTRACTION      Family History  Problem  Relation Age of Onset   Heart disease Mother    Cancer Maternal Aunt     Social History   Tobacco Use   Smoking status: Former    Types: Cigarettes    Quit date: 03/14/2015    Years since quitting: 7.6  Vaping Use   Vaping Use: Never used  Substance Use Topics   Alcohol use: No   Drug use: No    ROS   Objective:   Vitals: BP 136/86 (BP Location: Left Arm)   Pulse 77   Temp 98.4 F (36.9 C) (Oral)   Resp 18   SpO2 94%   Breastfeeding Yes   Physical Exam Constitutional:      General: She is not in acute distress.    Appearance: Normal appearance. She is well-developed. She is not ill-appearing, toxic-appearing or diaphoretic.  HENT:     Head: Normocephalic and atraumatic.     Right Ear: External ear normal.     Left Ear: External ear normal.     Nose: Congestion present. No rhinorrhea.     Mouth/Throat:     Mouth: Mucous membranes are moist.     Pharynx: No oropharyngeal exudate or posterior oropharyngeal erythema.  Eyes:     General: No scleral icterus.       Right eye: No discharge.        Left eye: No discharge.     Extraocular Movements: Extraocular movements intact.     Conjunctiva/sclera: Conjunctivae normal.     Pupils: Pupils are equal,  round, and reactive to light.  Cardiovascular:     Rate and Rhythm: Normal rate and regular rhythm.     Heart sounds: Normal heart sounds. No murmur heard.    No friction rub. No gallop.  Pulmonary:     Effort: Pulmonary effort is normal. No respiratory distress.     Breath sounds: No stridor. Wheezing (mild inspiratory upper-mid lung fields bilaterally) present. No rhonchi or rales.  Chest:     Chest wall: No tenderness.  Skin:    General: Skin is warm and dry.  Neurological:     General: No focal deficit present.     Mental Status: She is alert and oriented to person, place, and time.  Psychiatric:        Mood and Affect: Mood normal.        Behavior: Behavior normal.     Assessment and Plan :   PDMP not  reviewed this encounter.  1. Acute recurrent sinusitis, unspecified location   2. Allergic rhinitis, unspecified seasonality, unspecified trigger   3. Wheezing     Will start empiric treatment for sinusitis with cefdinir.  Recommended supportive care otherwise.  Offered chest x-ray but given that she has had 2 negative x-rays and has no signs of pneumonia or bronchitis on exam, we decided to hold off.  Patient is to maintain follow-up with the pulmonologist.  As an effort to help her with her breathing, I offered an oral prednisone course.  Patient is breast-feeding and therefore I recommended holding off on breast-feeding for 4 hours after taking the medication.  Counseled patient on potential for adverse effects with medications prescribed/recommended today, ER and return-to-clinic precautions discussed, patient verbalized understanding.    Jaynee Eagles, PA-C 10/29/22 1004

## 2022-10-31 ENCOUNTER — Encounter (HOSPITAL_BASED_OUTPATIENT_CLINIC_OR_DEPARTMENT_OTHER): Payer: Self-pay | Admitting: Pulmonary Disease

## 2022-10-31 ENCOUNTER — Ambulatory Visit (INDEPENDENT_AMBULATORY_CARE_PROVIDER_SITE_OTHER): Payer: Medicaid Other | Admitting: Pulmonary Disease

## 2022-10-31 VITALS — BP 128/70 | HR 78 | Ht 65.0 in | Wt 177.0 lb

## 2022-10-31 DIAGNOSIS — J42 Unspecified chronic bronchitis: Secondary | ICD-10-CM

## 2022-10-31 DIAGNOSIS — R062 Wheezing: Secondary | ICD-10-CM

## 2022-10-31 MED ORDER — BREZTRI AEROSPHERE 160-9-4.8 MCG/ACT IN AERO: 2.0000 | INHALATION_SPRAY | Freq: Two times a day (BID) | RESPIRATORY_TRACT | 0 refills | Status: DC

## 2022-10-31 NOTE — Progress Notes (Signed)
Subjective:   PATIENT ID: Tracey Nielsen GENDER: female DOB: 1976/02/26, MRN: [id number]  Chief Complaint  Patient presents with   Consult    Cough about 28mo sometime able to get out yellow green in color. She also had covid about yr     Reason for Visit: New consult for cough  Tracey Nielsen is a 47 year old former smoker with HTN, hx gestational DM2, hashimotos thyroiditis who presents for evaluation for cough.  She reports cold/pneumonia in October 2023, three months ago. Has had persistent cough associated with shortness of breath. Worsens with activity. Improves with rest and albuterol inhaler. Records reviewed from PCP 10/03/22. Started on albuterol and referred to Pulmonary for further evaluation.  She has had another cold this week requiring prednisone and antibiotic (cefdinir) that was prescribed by urgent care on 10/29/22. Needing to use albuterol every 2 hours. She has a newborn at home and she finds herself coughing frequently at night. She reports a history of smoker's congestion/cough. She feels she is having difficulty getting a deep breath compared to before October.  Social History: Former smoker. Quit in 2016. 1 ppd x 20 years. Started smoking 47 years old.  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  Past Medical History:  Diagnosis Date   Gestational diabetes    Hypothyroidism    Pregnancy induced hypertension      Family History  Problem Relation Age of Onset   Heart disease Mother    Thyroid disease Brother    Cancer Maternal Aunt      Social History   Occupational History   Not on file  Tobacco Use   Smoking status: Former    Packs/day: 1.00    Years: 20.00    Total pack years: 20.00    Types: Cigarettes    Quit date: 03/14/2015    Years since quitting: 7.6   Smokeless tobacco: Not on file  Vaping Use   Vaping Use: Never used   Substance and Sexual Activity   Alcohol use: No   Drug use: No   Sexual activity: Yes    Allergies  Allergen Reactions   Amoxicillin Rash    No cross-reaction with penicillin     Outpatient Medications Prior to Visit  Medication Sig Dispense Refill   cefdinir (OMNICEF) 300 MG capsule Take 1 capsule (300 mg total) by mouth 2 (two) times daily. 20 capsule 0   cetirizine (ZYRTEC ALLERGY) 10 MG tablet Take 1 tablet (10 mg total) by mouth daily. 30 tablet 0   Dextromethorphan-Menthol (DELSYM COUGH RELIEF MT) Use as directed in the mouth or throat.     levalbuterol (XOPENEX HFA) 45 MCG/ACT inhaler Inhale 2 puffs into the lungs every 6 (six) hours as needed for wheezing or shortness of breath (cough). 45 g 0   levonorgestrel (MIRENA, 52 MG,) 20 MCG/DAY IUD Device supplied by care center     levothyroxine (SYNTHROID) 88 MCG tablet Take 1 tablet (88 mcg total) by mouth daily before breakfast. 30 tablet 1   liothyronine (CYTOMEL) 5 MCG tablet Take 5 mcg by mouth daily.     predniSONE (DELTASONE) 20 MG tablet Take 2 tablets daily with breakfast. 10 tablet 0   No facility-administered medications prior to visit.    Review of Systems  Constitutional:  Negative for chills, diaphoresis, fever, malaise/fatigue and weight loss.  HENT:  Negative for congestion.   Respiratory:  Positive for  cough and wheezing. Negative for hemoptysis, sputum production and shortness of breath.   Cardiovascular:  Negative for chest pain, palpitations and leg swelling.     Objective:   Vitals:   10/31/22 1255  BP: 128/70  Pulse: 78  SpO2: 96%  Weight: 177 lb (80.3 kg)  Height: 5\' 5"  (1.651 m)   SpO2: 96 % O2 Device: None (Room air)  Physical Exam: General: Well-appearing, no acute distress HENT: [place], AT Eyes: EOMI, no scleral icterus Respiratory: Expiratory wheezing in the bases. No rhonchi Cardiovascular: RRR, -M/R/G, no JVD Extremities:-Edema,-tenderness Neuro: AAO x4, CNII-XII grossly  intact Psych: Normal mood, normal affect  Data Reviewed:  Imaging: CXR 07/19/22 - No infiltrate, effusion or edema CXR 10/03/22 - No infiltrate, effusion or edema  PFT: None on file  Labs: CBC    Component Value Date/Time   WBC 9.8 10/02/2021 2302   RBC 4.29 10/02/2021 2302   HGB 13.2 10/02/2021 2302   HCT 39.5 10/02/2021 2302   PLT 355 10/02/2021 2302   MCV 92.1 10/02/2021 2302   MCH 30.8 10/02/2021 2302   MCHC 33.4 10/02/2021 2302   RDW 13.5 10/02/2021 2302   LYMPHSABS 1.1 09/27/2021 1800   MONOABS 0.8 09/27/2021 1800   EOSABS 0.1 09/27/2021 1800   BASOSABS 0.0 09/27/2021 1800   Abs eos 09/27/21 - 100     Assessment & Plan:   Discussion: 47 year old former smoker with HTN, hx gestational DM2, hashimotos thyroiditis who presents for evaluation for cough. Ddx including post-viral +/- undiagnosed obstructive lung disease vs chronic bronchitis. With her current symptoms she would like benefit from maintenance inhaler in the short term. PFTs would help determine long term need if she does have COPD +/- asthma.   Post viral wheezing Possible COPD/asthma Chronic bronchitis --START Breztri TWO puffs in the morning and evening. Rinse out mouth after use --ORDER pulmonary function tests when next available. HOLD inhalers 48 hours prior to breathing tests. Will call with results  Health Maintenance Immunization History  Administered Date(s) Administered   Influenza,inj,Quad PF,6+ Mos 10/03/2022   PFIZER(Purple Top)SARS-COV-2 Vaccination 12/13/2019, 01/05/2020, 08/27/2020   CT Lung Screen - not qualified due to age. Reassess at 55  No orders of the defined types were placed in this encounter.  Meds ordered this encounter  Medications   Budeson-Glycopyrrol-Formoterol (BREZTRI AEROSPHERE) 160-9-4.8 MCG/ACT AERO    Sig: Inhale 2 puffs into the lungs in the morning and at bedtime.    Dispense:  5.9 g    Refill:  0    Order Specific Question:   Manufacturer?    Answer:    [employer] [[id number]]    No follow-ups on file. After PFTs  I have spent a total time of 45-minutes on the day of the appointment reviewing prior documentation, coordinating care and discussing medical diagnosis and plan with the patient/family. Imaging, labs and tests included in this note have been reviewed and interpreted independently by me.  [place], MD [facility] 10/31/2022 2:31 PM  Office Number [phone]

## 2022-10-31 NOTE — Patient Instructions (Addendum)
Post viral wheezing Possible COPD/asthma Chronic bronchitis --START Breztri TWO puffs in the morning and evening. Rinse out mouth after use --ORDER pulmonary function tests when next available. HOLD inhalers 48 hours prior to breathing tests. Will call with results  Follow-up with me after PFTs. OK to schedule PFT on separate day

## 2022-10-31 NOTE — Addendum Note (Signed)
Addended by: Darliss Ridgel on: 10/31/2022 04:26 PM   Modules accepted: Orders

## 2022-11-09 ENCOUNTER — Other Ambulatory Visit (HOSPITAL_COMMUNITY): Payer: Self-pay

## 2022-11-09 ENCOUNTER — Other Ambulatory Visit (HOSPITAL_BASED_OUTPATIENT_CLINIC_OR_DEPARTMENT_OTHER): Payer: Self-pay | Admitting: Family Medicine

## 2022-11-09 ENCOUNTER — Other Ambulatory Visit (HOSPITAL_BASED_OUTPATIENT_CLINIC_OR_DEPARTMENT_OTHER): Payer: Self-pay

## 2022-11-09 MED ORDER — LIOTHYRONINE SODIUM 5 MCG PO TABS
5.0000 ug | ORAL_TABLET | Freq: Every day | ORAL | 1 refills | Status: DC
  Filled 2022-11-09 – 2022-11-11 (×2): qty 30, 30d supply, fill #0
  Filled 2022-12-09: qty 30, 30d supply, fill #1

## 2022-11-10 ENCOUNTER — Other Ambulatory Visit (HOSPITAL_COMMUNITY): Payer: Self-pay

## 2022-11-11 ENCOUNTER — Other Ambulatory Visit (HOSPITAL_COMMUNITY): Payer: Self-pay

## 2022-11-27 ENCOUNTER — Ambulatory Visit (INDEPENDENT_AMBULATORY_CARE_PROVIDER_SITE_OTHER): Payer: Medicaid Other | Admitting: Pulmonary Disease

## 2022-11-27 DIAGNOSIS — J42 Unspecified chronic bronchitis: Secondary | ICD-10-CM | POA: Diagnosis not present

## 2022-11-27 LAB — PULMONARY FUNCTION TEST
DL/VA % pred: 118 %
DL/VA: 5.09 ml/min/mmHg/L
DLCO cor % pred: 104 %
DLCO cor: 23.7 ml/min/mmHg
DLCO unc % pred: 104 %
DLCO unc: 23.7 ml/min/mmHg
FEF 25-75 Post: 3.38 L/sec
FEF 25-75 Pre: 2.06 L/sec
FEF2575-%Change-Post: 63 %
FEF2575-%Pred-Post: 112 %
FEF2575-%Pred-Pre: 68 %
FEV1-%Change-Post: 13 %
FEV1-%Pred-Post: 87 %
FEV1-%Pred-Pre: 77 %
FEV1-Post: 2.69 L
FEV1-Pre: 2.37 L
FEV1FVC-%Change-Post: 7 %
FEV1FVC-%Pred-Pre: 96 %
FEV6-%Change-Post: 5 %
FEV6-%Pred-Post: 85 %
FEV6-%Pred-Pre: 80 %
FEV6-Post: 3.21 L
FEV6-Pre: 3.04 L
FEV6FVC-%Pred-Post: 102 %
FEV6FVC-%Pred-Pre: 102 %
FVC-%Change-Post: 5 %
FVC-%Pred-Post: 83 %
FVC-%Pred-Pre: 79 %
FVC-Post: 3.21 L
FVC-Pre: 3.04 L
Post FEV1/FVC ratio: 84 %
Post FEV6/FVC ratio: 100 %
Pre FEV1/FVC ratio: 78 %
Pre FEV6/FVC Ratio: 100 %
RV % pred: 103 %
RV: 1.89 L
TLC % pred: 97 %
TLC: 5.22 L

## 2022-11-27 NOTE — Progress Notes (Signed)
Full PFT Performed Today. 

## 2022-11-27 NOTE — Patient Instructions (Signed)
Full PFT Performed Today. 

## 2022-11-29 ENCOUNTER — Telehealth (HOSPITAL_BASED_OUTPATIENT_CLINIC_OR_DEPARTMENT_OTHER): Payer: Self-pay | Admitting: Pulmonary Disease

## 2022-11-29 ENCOUNTER — Encounter (HOSPITAL_BASED_OUTPATIENT_CLINIC_OR_DEPARTMENT_OTHER): Payer: Self-pay | Admitting: Pulmonary Disease

## 2022-11-29 NOTE — Telephone Encounter (Signed)
Sent provider message on [employer]

## 2022-11-29 NOTE — Telephone Encounter (Signed)
Attempted to call patient regarding PFT results. No answer. Left message with callback number.  If she returns call please communicate the following: FVC 3.21 (83%) FEV1 2.69 (87%) Ratio 84 TLC 97% DLCO 104%. + BD response in FEV 13%  PFT interpretation: Normal PFTs with significant bronchodilator response suggestive of asthma  If her current inhaler is helping with her symptoms, it will be ok to order Symbicort 160-4.5 mcg TWO puffs TWICE a day  She will also need follow-up with me in 3 months

## 2022-12-01 ENCOUNTER — Telehealth: Payer: Self-pay | Admitting: Pulmonary Disease

## 2022-12-01 MED ORDER — BUDESONIDE-FORMOTEROL FUMARATE 160-4.5 MCG/ACT IN AERO: 2.0000 | INHALATION_SPRAY | Freq: Two times a day (BID) | RESPIRATORY_TRACT | 6 refills | Status: DC

## 2022-12-01 NOTE — Telephone Encounter (Signed)
Rx for symbicort has been sent to preferred pharmacy for pt. Attempted to call pt but unable to reach. Left detailed message for pt letting her know that this had been sent. Nothing further needed.

## 2022-12-01 NOTE — Telephone Encounter (Signed)
Pt needs order for Symbicort 160-4.5 mcg  [facility] [place] on [address]

## 2022-12-06 ENCOUNTER — Ambulatory Visit (HOSPITAL_BASED_OUTPATIENT_CLINIC_OR_DEPARTMENT_OTHER): Payer: Medicaid Other | Admitting: Family Medicine

## 2022-12-06 ENCOUNTER — Encounter (HOSPITAL_BASED_OUTPATIENT_CLINIC_OR_DEPARTMENT_OTHER): Payer: Self-pay

## 2022-12-06 ENCOUNTER — Encounter (HOSPITAL_BASED_OUTPATIENT_CLINIC_OR_DEPARTMENT_OTHER): Payer: Self-pay | Admitting: Family Medicine

## 2022-12-06 VITALS — BP 122/73 | HR 70 | Ht 65.0 in | Wt 184.0 lb

## 2022-12-06 DIAGNOSIS — Z0000 Encounter for general adult medical examination without abnormal findings: Secondary | ICD-10-CM

## 2022-12-06 DIAGNOSIS — R0602 Shortness of breath: Secondary | ICD-10-CM

## 2022-12-06 DIAGNOSIS — E038 Other specified hypothyroidism: Secondary | ICD-10-CM

## 2022-12-06 DIAGNOSIS — E063 Autoimmune thyroiditis: Secondary | ICD-10-CM | POA: Diagnosis not present

## 2022-12-06 NOTE — Assessment & Plan Note (Signed)
At last appointment, we did check TSH and this was found to be below normal limit.  As result, we did decrease dose of levothyroxine to 88 mcg.  Patient has been taking this for about 6 weeks.  She has not noticed any change in symptoms.  At time of last visit, she was asymptomatic as well. Would be due for recheck of TSH today to assess progress with new dose.  We will proceed with any further dose changes according to results of TSH assessment.

## 2022-12-06 NOTE — Progress Notes (Signed)
    Procedures performed today:    None.  Independent interpretation of notes and tests performed by another provider:   None.  Brief History, Exam, Impression, and Recommendations:    BP 122/73 (BP Location: Left Arm, Patient Position: Sitting, Cuff Size: Normal)   Pulse 70   Ht 5\' 5"  (1.651 m)   Wt 184 lb (83.5 kg)   SpO2 96%   BMI 30.62 kg/m   Hypothyroidism At last appointment, we did check TSH and this was found to be below normal limit.  As result, we did decrease dose of levothyroxine to 88 mcg.  Patient has been taking this for about 6 weeks.  She has not noticed any change in symptoms.  At time of last visit, she was asymptomatic as well. Would be due for recheck of TSH today to assess progress with new dose.  We will proceed with any further dose changes according to results of TSH assessment.  Shortness of breath Patient did have evaluation with pulmonology.  She was started on inhalers and also had pulmonary function testing completed.  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  She was managed accordingly with utilization of inhaler.  She notes great improvement in symptoms with use of inhaler.  Currently doing quite well in regards to symptoms  Return in about 3 months (around 03/08/2023) for CPE with [facility] a few days prior.   ___________________________________________ Bryley Chrisman de [place], MD, ABFM, CAQSM Primary Care and [place]

## 2022-12-06 NOTE — Assessment & Plan Note (Addendum)
Patient did have evaluation with pulmonology.  She was started on inhalers and also had pulmonary function testing completed.  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  She was managed accordingly with utilization of inhaler.  She notes great improvement in symptoms with use of inhaler.  Currently doing quite well in regards to symptoms

## 2022-12-07 LAB — TSH: TSH: 0.978 u[IU]/mL (ref 0.450–4.500)

## 2022-12-09 ENCOUNTER — Other Ambulatory Visit (HOSPITAL_COMMUNITY): Payer: Self-pay

## 2022-12-14 ENCOUNTER — Other Ambulatory Visit (HOSPITAL_COMMUNITY): Payer: Self-pay

## 2022-12-19 ENCOUNTER — Other Ambulatory Visit (HOSPITAL_BASED_OUTPATIENT_CLINIC_OR_DEPARTMENT_OTHER): Payer: Self-pay | Admitting: Family Medicine

## 2023-01-25 ENCOUNTER — Other Ambulatory Visit (HOSPITAL_BASED_OUTPATIENT_CLINIC_OR_DEPARTMENT_OTHER): Payer: Self-pay | Admitting: Family Medicine

## 2023-01-26 ENCOUNTER — Other Ambulatory Visit (HOSPITAL_COMMUNITY): Payer: Self-pay

## 2023-01-26 MED ORDER — LIOTHYRONINE SODIUM 5 MCG PO TABS
5.0000 ug | ORAL_TABLET | Freq: Every day | ORAL | 0 refills | Status: DC
  Filled 2023-01-26: qty 30, 30d supply, fill #0

## 2023-01-31 ENCOUNTER — Other Ambulatory Visit (HOSPITAL_BASED_OUTPATIENT_CLINIC_OR_DEPARTMENT_OTHER): Payer: Medicaid Other

## 2023-01-31 ENCOUNTER — Other Ambulatory Visit (HOSPITAL_BASED_OUTPATIENT_CLINIC_OR_DEPARTMENT_OTHER): Payer: Self-pay | Admitting: Family Medicine

## 2023-02-01 LAB — COMPREHENSIVE METABOLIC PANEL
ALT: 17 IU/L (ref 0–32)
AST: 21 IU/L (ref 0–40)
Albumin/Globulin Ratio: 1.7 (ref 1.2–2.2)
Albumin: 4.4 g/dL (ref 3.9–4.9)
Alkaline Phosphatase: 82 IU/L (ref 44–121)
BUN/Creatinine Ratio: 14 (ref 9–23)
BUN: 11 mg/dL (ref 6–24)
Bilirubin Total: 0.3 mg/dL (ref 0.0–1.2)
CO2: 18 mmol/L — ABNORMAL LOW (ref 20–29)
Calcium: 9.6 mg/dL (ref 8.7–10.2)
Chloride: 104 mmol/L (ref 96–106)
Creatinine, Ser: 0.77 mg/dL (ref 0.57–1.00)
Globulin, Total: 2.6 g/dL (ref 1.5–4.5)
Glucose: 128 mg/dL — ABNORMAL HIGH (ref 70–99)
Potassium: 4.4 mmol/L (ref 3.5–5.2)
Sodium: 138 mmol/L (ref 134–144)
Total Protein: 7 g/dL (ref 6.0–8.5)
eGFR: 96 mL/min/{1.73_m2} (ref >59)

## 2023-02-01 LAB — CBC WITH DIFFERENTIAL/PLATELET
Basophils Absolute: 0.1 10*3/uL (ref 0.0–0.2)
Basos: 1 %
EOS (ABSOLUTE): 0.4 10*3/uL (ref 0.0–0.4)
Eos: 4 %
Hematocrit: 41.3 % (ref 34.0–46.6)
Hemoglobin: 13.9 g/dL (ref 11.1–15.9)
Immature Grans (Abs): 0.1 10*3/uL (ref 0.0–0.1)
Immature Granulocytes: 1 %
Lymphocytes Absolute: 2.7 10*3/uL (ref 0.7–3.1)
Lymphs: 29 %
MCH: 29 pg (ref 26.6–33.0)
MCHC: 33.7 g/dL (ref 31.5–35.7)
MCV: 86 fL (ref 79–97)
Monocytes Absolute: 0.7 10*3/uL (ref 0.1–0.9)
Monocytes: 8 %
Neutrophils Absolute: 5.6 10*3/uL (ref 1.4–7.0)
Neutrophils: 57 %
Platelets: 361 10*3/uL (ref 150–450)
RBC: 4.79 x10E6/uL (ref 3.77–5.28)
RDW: 13 % (ref 11.7–15.4)
WBC: 9.6 10*3/uL (ref 3.4–10.8)

## 2023-02-01 LAB — LIPID PANEL
Chol/HDL Ratio: 5.1 ratio — ABNORMAL HIGH (ref 0.0–4.4)
Cholesterol, Total: 239 mg/dL — ABNORMAL HIGH (ref 100–199)
HDL: 47 mg/dL (ref >39)
LDL Chol Calc (NIH): 172 mg/dL — ABNORMAL HIGH (ref 0–99)
Triglycerides: 114 mg/dL (ref 0–149)
VLDL Cholesterol Cal: 20 mg/dL (ref 5–40)

## 2023-02-01 LAB — T4F: T4,Free (Direct): 0.84 ng/dL (ref 0.82–1.77)

## 2023-02-01 LAB — HEMOGLOBIN A1C
Est. average glucose Bld gHb Est-mCnc: 143 mg/dL
Hgb A1c MFr Bld: 6.6 % — ABNORMAL HIGH (ref 4.8–5.6)

## 2023-02-01 LAB — TSH RFX ON ABNORMAL TO FREE T4: TSH: 5.4 u[IU]/mL — ABNORMAL HIGH (ref 0.450–4.500)

## 2023-02-07 ENCOUNTER — Encounter (HOSPITAL_BASED_OUTPATIENT_CLINIC_OR_DEPARTMENT_OTHER): Payer: Medicaid Other | Admitting: Family Medicine

## 2023-02-09 ENCOUNTER — Ambulatory Visit (INDEPENDENT_AMBULATORY_CARE_PROVIDER_SITE_OTHER): Payer: Medicaid Other | Admitting: Family Medicine

## 2023-02-09 ENCOUNTER — Encounter (HOSPITAL_BASED_OUTPATIENT_CLINIC_OR_DEPARTMENT_OTHER): Payer: Self-pay | Admitting: Family Medicine

## 2023-02-09 VITALS — BP 124/82 | HR 70 | Temp 98.4°F | Ht 65.0 in | Wt 187.4 lb

## 2023-02-09 DIAGNOSIS — Z0000 Encounter for general adult medical examination without abnormal findings: Secondary | ICD-10-CM | POA: Diagnosis not present

## 2023-02-09 DIAGNOSIS — E038 Other specified hypothyroidism: Secondary | ICD-10-CM

## 2023-02-09 DIAGNOSIS — Z1211 Encounter for screening for malignant neoplasm of colon: Secondary | ICD-10-CM

## 2023-02-09 DIAGNOSIS — E119 Type 2 diabetes mellitus without complications: Secondary | ICD-10-CM | POA: Insufficient documentation

## 2023-02-09 MED ORDER — LEVOTHYROXINE SODIUM 100 MCG PO TABS: 100.0000 ug | ORAL_TABLET | Freq: Every day | ORAL | 1 refills | Status: DC

## 2023-02-09 NOTE — Progress Notes (Signed)
Subjective:    CC: Annual Physical Exam  HPI:  Isbella Vigh is a 47 y.o. presenting for annual physical  I reviewed the past medical history, family history, social history, surgical history, and allergies today and no changes were needed.  Please see the problem list section below in epic for further details.  Past Medical History: Past Medical History:  Diagnosis Date   Gestational diabetes    Hypothyroidism    Pregnancy induced hypertension    Past Surgical History: Past Surgical History:  Procedure Laterality Date   APPENDECTOMY     WISDOM TOOTH EXTRACTION     Social History: Social History   Socioeconomic History   Marital status: Single    Spouse name: Not on file   Number of children: Not on file   Years of education: Not on file   Highest education level: Not on file  Occupational History   Not on file  Tobacco Use   Smoking status: Former    Packs/day: 1.00    Years: 20.00    Additional pack years: 0.00    Total pack years: 20.00    Types: Cigarettes    Quit date: 03/14/2015    Years since quitting: 7.9   Smokeless tobacco: Not on file  Vaping Use   Vaping Use: Never used  Substance and Sexual Activity   Alcohol use: No   Drug use: No   Sexual activity: Yes  Other Topics Concern   Not on file  Social History Narrative   Not on file   Social Determinants of Health   Financial Resource Strain: Not on file  Food Insecurity: No Food Insecurity (08/10/2021)   Hunger Vital Sign    Worried About Running Out of Food in the Last Year: Never true    Ran Out of Food in the Last Year: Never true  Transportation Needs: Not on file  Physical Activity: Not on file  Stress: Not on file  Social Connections: Not on file   Family History: Family History  Problem Relation Age of Onset   Heart disease Mother    Thyroid disease Brother    Cancer Maternal Aunt    Allergies: Allergies  Allergen Reactions   Amoxicillin Rash    No cross-reaction with  penicillin   Medications: See med rec.  Review of Systems: No headache, visual changes, nausea, vomiting, diarrhea, constipation, dizziness, abdominal pain, skin rash, fevers, chills, night sweats, swollen lymph nodes, weight loss, chest pain, body aches, joint swelling, muscle aches, shortness of breath, mood changes, visual or auditory hallucinations.  Objective:    BP 124/82   Pulse 70   Temp 98.4 F (36.9 C) (Oral)   Ht 5\' 5"  (1.651 m)   Wt 187 lb 6.4 oz (85 kg)   LMP 02/01/2023 (Exact Date)   SpO2 96%   BMI 31.18 kg/m   General: Well Developed, well nourished, and in no acute distress. Neuro: Alert and oriented x3, extra-ocular muscles intact, sensation grossly intact. Cranial nerves II through XII are intact, motor, sensory, and coordinative functions are all intact. HEENT: Normocephalic, atraumatic, pupils equal round reactive to light, neck supple, no masses, no lymphadenopathy, thyroid nonpalpable. Oropharynx, nasopharynx, external ear canals are unremarkable. Skin: Warm and dry, no rashes noted. Cardiac: Regular rate and rhythm, no murmurs rubs or gallops. Respiratory: Clear to auscultation bilaterally. Not using accessory muscles, speaking in full sentences. Abdominal: Soft, nontender, nondistended, positive bowel sounds, no masses, no organomegaly. Musculoskeletal: Shoulder, elbow, wrist, hip, knee, ankle stable, and  with full range of motion.  Impression and Recommendations:    Wellness examination Routine HCM labs reviewed. HCM reviewed/discussed. Anticipatory guidance regarding healthy weight, lifestyle and choices given. Recommend healthy diet.  Recommend approximately 150 minutes/week of moderate intensity exercise Recommend regular dental and vision exams Always use seatbelt/lap and shoulder restraints Recommend using smoke alarms and checking batteries at least twice a year Recommend using sunscreen when outside Discussed colon cancer screening recommendations,  options.  Patient would like to proceed with Cologuard, order placed Discussed tetanus immunization recommendations, patient believes she received with recent pregnancy  Hypothyroidism Unfortunate, with recent dose change of levothyroxine, TSH is now slightly above reference range.  Discussed treatment considerations, we will proceed with dose adjustment in levothyroxine with slight increase in dose to 100 mcg daily.  Plan for recheck of TSH in about 8 weeks.  She is currently euthyroid clinically  Diabetes mellitus (HCC) New diagnosis for patient with A1c found to be 6.6% on recent labs.  She does have history of gestational diabetes which was managed with insulin during her pregnancy.  She does feel that her diet has been less than ideal due to life circumstances. She is currently breast-feeding and wishes to continue to do so for about the next 8 months until her daughter is 88 years old.  As such, she would prefer to avoid any medications due to concern for potential interaction with breast-feeding.  She will more closely monitor her diet and focus on lifestyle modifications. Offered referral to nutritionist/dietitian, patient declines today as she has engaged in this in the past with her gestational diabetes At future visits, will need to complete foot exam, discuss further immunization recommendations, complete nephropathy screening Plan for follow-up in about 3 months to further review diabetes management, monitor control with A1c about 1 week prior to next appointment  Return in about 3 months (around 05/12/2023) for DM, Hypothyroidism.   ___________________________________________ Darnell Jeschke de [place], MD, ABFM, CAQSM Primary Care and Sports Medicine [facility]

## 2023-02-09 NOTE — Assessment & Plan Note (Signed)
Routine HCM labs reviewed. HCM reviewed/discussed. Anticipatory guidance regarding healthy weight, lifestyle and choices given. Recommend healthy diet.  Recommend approximately 150 minutes/week of moderate intensity exercise Recommend regular dental and vision exams Always use seatbelt/lap and shoulder restraints Recommend using smoke alarms and checking batteries at least twice a year Recommend using sunscreen when outside Discussed colon cancer screening recommendations, options.  Patient would like to proceed with Cologuard, order placed Discussed tetanus immunization recommendations, patient believes she received with recent pregnancy

## 2023-02-09 NOTE — Assessment & Plan Note (Signed)
Unfortunate, with recent dose change of levothyroxine, TSH is now slightly above reference range.  Discussed treatment considerations, we will proceed with dose adjustment in levothyroxine with slight increase in dose to 100 mcg daily.  Plan for recheck of TSH in about 8 weeks.  She is currently euthyroid clinically

## 2023-02-09 NOTE — Assessment & Plan Note (Signed)
New diagnosis for patient with A1c found to be 6.6% on recent labs.  She does have history of gestational diabetes which was managed with insulin during her pregnancy.  She does feel that her diet has been less than ideal due to life circumstances. She is currently breast-feeding and wishes to continue to do so for about the next 8 months until her daughter is 47 years old.  As such, she would prefer to avoid any medications due to concern for potential interaction with breast-feeding.  She will more closely monitor her diet and focus on lifestyle modifications. Offered referral to nutritionist/dietitian, patient declines today as she has engaged in this in the past with her gestational diabetes At future visits, will need to complete foot exam, discuss further immunization recommendations, complete nephropathy screening Plan for follow-up in about 3 months to further review diabetes management, monitor control with A1c about 1 week prior to next appointment

## 2023-03-03 ENCOUNTER — Other Ambulatory Visit (HOSPITAL_BASED_OUTPATIENT_CLINIC_OR_DEPARTMENT_OTHER): Payer: Self-pay | Admitting: Family Medicine

## 2023-03-05 ENCOUNTER — Other Ambulatory Visit (HOSPITAL_COMMUNITY): Payer: Self-pay

## 2023-03-05 ENCOUNTER — Encounter (HOSPITAL_BASED_OUTPATIENT_CLINIC_OR_DEPARTMENT_OTHER): Payer: Self-pay | Admitting: Pulmonary Disease

## 2023-03-05 ENCOUNTER — Encounter (HOSPITAL_BASED_OUTPATIENT_CLINIC_OR_DEPARTMENT_OTHER): Payer: Self-pay

## 2023-03-05 ENCOUNTER — Ambulatory Visit (HOSPITAL_BASED_OUTPATIENT_CLINIC_OR_DEPARTMENT_OTHER): Payer: Medicaid Other | Admitting: Pulmonary Disease

## 2023-03-05 VITALS — BP 116/80 | HR 67 | Temp 98.1°F | Ht 65.0 in | Wt 185.6 lb

## 2023-03-05 DIAGNOSIS — J4530 Mild persistent asthma, uncomplicated: Secondary | ICD-10-CM | POA: Diagnosis not present

## 2023-03-05 MED ORDER — ALBUTEROL SULFATE HFA 108 (90 BASE) MCG/ACT IN AERS: 2.0000 | INHALATION_SPRAY | Freq: Four times a day (QID) | RESPIRATORY_TRACT | 1 refills | Status: AC | PRN

## 2023-03-05 MED ORDER — LIOTHYRONINE SODIUM 5 MCG PO TABS
5.0000 ug | ORAL_TABLET | Freq: Every day | ORAL | 0 refills | Status: DC
  Filled 2023-03-05: qty 30, 30d supply, fill #0

## 2023-03-05 NOTE — Progress Notes (Addendum)
Subjective:   PATIENT ID: Tracey Nielsen GENDER: female DOB: [birth year], MRN: [id number]  Chief Complaint  Patient presents with   Follow-up    Follow up. Patient wants to discuss inhalers.     Reason for Visit: Follow-up  Ms. Tracey Nielsen is a 47 year old former smoker with HTN, hx gestational DM2, hashimotos thyroiditis who presents for follow-up.  Initial consult She reports cold/pneumonia in October 2023, three months ago. Has had persistent cough associated with shortness of breath. Worsens with activity. Improves with rest and albuterol inhaler. Records reviewed from PCP 10/03/22. Started on albuterol and referred to Pulmonary for further evaluation.  She has had another cold this week requiring prednisone and antibiotic (cefdinir) that was prescribed by urgent care on 10/29/22. Needing to use albuterol every 2 hours. She has a newborn at home and she finds herself coughing frequently at night. She reports a history of smoker's congestion/cough. She feels she is having difficulty getting a deep breath compared to before October.  03/13/23 Compliant with Symbicort. Not needing rescue inhaler. Presents today to review PFTs      No data to display         Social History: Former smoker. Quit in 2016. 1 ppd x 20 years. Started smoking 47 years old.  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   Past Medical History:  Diagnosis Date   Gestational diabetes    Hypothyroidism    Pregnancy induced hypertension      Family History  Problem Relation Age of Onset   Heart disease Mother    Thyroid disease Brother    Cancer Maternal Aunt      Social History   Occupational History   Not on file  Tobacco Use   Smoking status: Former    Packs/day: 1.00    Years: 20.00    Additional pack years: 0.00    Total pack years: 20.00    Types: Cigarettes    Quit date: 03/14/2015    Years since quitting: 7.9   Smokeless tobacco: Not on file  Vaping Use   Vaping Use: Never  used  Substance and Sexual Activity   Alcohol use: No   Drug use: No   Sexual activity: Yes    Allergies  Allergen Reactions   Amoxicillin Rash    No cross-reaction with penicillin     Outpatient Medications Prior to Visit  Medication Sig Dispense Refill   budesonide-formoterol (SYMBICORT) 160-4.5 MCG/ACT inhaler Inhale 2 puffs into the lungs in the morning and at bedtime. 10.2 g 6   cetirizine (ZYRTEC ALLERGY) 10 MG tablet Take 1 tablet (10 mg total) by mouth daily. 30 tablet 0   levalbuterol (XOPENEX HFA) 45 MCG/ACT inhaler Inhale 2 puffs into the lungs every 6 (six) hours as needed for wheezing or shortness of breath (cough). 45 g 0   levonorgestrel (MIRENA, 52 MG,) 20 MCG/DAY IUD Device supplied by care center     levothyroxine (SYNTHROID) 100 MCG tablet Take 1 tablet (100 mcg total) by mouth daily before breakfast. 30 tablet 1   liothyronine (CYTOMEL) 5 MCG tablet Take 1 tablet by mouth daily. 30 tablet 0   No facility-administered medications prior to visit.    Review of Systems  Constitutional:  Negative for chills, diaphoresis, fever, malaise/fatigue and weight loss.  HENT:  Negative for congestion.   Respiratory:  Positive for cough and wheezing. Negative for hemoptysis, sputum production and shortness of breath.   Cardiovascular:  Negative for chest pain,  palpitations and leg swelling.     Objective:   Vitals:   03/05/23 1404  BP: 116/80  Pulse: 67  Temp: 98.1 F (36.7 C)  TempSrc: Oral  SpO2: 95%  Weight: 185 lb 9.6 oz (84.2 kg)  Height: 5\' 5"  (1.651 m)   SpO2: 95 % O2 Device: None (Room air)  Physical Exam: General: Well-appearing, no acute distress HENT: [place], AT Eyes: EOMI, no scleral icterus Respiratory: Clear to auscultation bilaterally.  No crackles, wheezing or rales Cardiovascular: RRR, -M/R/G, no JVD Extremities:-Edema,-tenderness Neuro: AAO x4, CNII-XII grossly intact Psych: Normal mood, normal affect  Data Reviewed:  Imaging: CXR  07/19/22 - No infiltrate, effusion or edema CXR 10/03/22 - No infiltrate, effusion or edema  PFT: 11/27/22 FVC 3.04 (79%) FEV1 3.21 (83%) Ratio 78  TLC 97% DLCO 104% Interpretation: Normal spirometry with significant bronchodilator response consistent with asthma  Labs: CBC    Component Value Date/Time   WBC 9.6 01/31/2023 0844   WBC 9.8 10/02/2021 2302   RBC 4.79 01/31/2023 0844   RBC 4.29 10/02/2021 2302   HGB 13.9 01/31/2023 0844   HCT 41.3 01/31/2023 0844   PLT 361 01/31/2023 0844   MCV 86 01/31/2023 0844   MCH 29.0 01/31/2023 0844   MCH 30.8 10/02/2021 2302   MCHC 33.7 01/31/2023 0844   MCHC 33.4 10/02/2021 2302   RDW 13.0 01/31/2023 0844   LYMPHSABS 2.7 01/31/2023 0844   MONOABS 0.8 09/27/2021 1800   EOSABS 0.4 01/31/2023 0844   BASOSABS 0.1 01/31/2023 0844   Abs eos 09/27/21 - 100     Assessment & Plan:   Discussion: 47 year old former smoker with asthma, HTN, hx gestational DM2, hashimotos thyroiditis who presents for follow-up. Improved symptoms on ICS/LABA. Reviewed PFTs which were consistent with asthma so recommended continuing maintenance bronchodilators. Discussed clinical course and management of asthma including bronchodilator regimen, preventive care and action plan for exacerbation.   Mild persistent asthma --CONTINUE Symbicort 160-4.5 mcg puffs in the morning and evening. Rinse out mouth after use --REFILL Albuterol. Discontinue Xopenex  Asthma Action Plan Use Albuterol every 4 hours as needed for worsening shortness of breath, wheezing and cough. If you symptoms do not improve in 24-48 hours, please our office for evaluation and/or prednisone taper.  Health Maintenance Immunization History  Administered Date(s) Administered   Influenza Inj Mdck Quad Pf 09/07/2020   Influenza,inj,Quad PF,6+ Mos 07/14/2021, 10/03/2022   Influenza-Unspecified 09/07/2020   PFIZER(Purple Top)SARS-COV-2 Vaccination 12/13/2019, 01/05/2020, 08/27/2020   Tdap 07/14/2010,  07/27/2021   CT Lung Screen - not qualified due to age. Reassess at 55  No orders of the defined types were placed in this encounter.  Meds ordered this encounter  Medications   albuterol (VENTOLIN HFA) 108 (90 Base) MCG/ACT inhaler    Sig: Inhale 2 puffs into the lungs every 6 (six) hours as needed for wheezing or shortness of breath.    Dispense:  8 g    Refill:  1    OK to distribute insurance preferred    Return in about 1 year (around 03/04/2024).  I have spent a total time of 20-minutes on the day of the appointment including chart review, data review, collecting history, coordinating care and discussing medical diagnosis and plan with the patient/family. Past medical history, allergies, medications were reviewed. Pertinent imaging, labs and tests included in this note have been reviewed and interpreted independently by me.  Alekzander Cardell Mechele Collin, MD [facility] 03/05/2023 2:09 PM  Office Number [phone]

## 2023-03-05 NOTE — Patient Instructions (Signed)
Mild persistent asthma --CONTINUE Symbicort 160-4.5 mcg puffs in the morning and evening. Rinse out mouth after use --REFILL Albuterol. Discontinue Xopenex  Asthma Action Plan Use Albuterol every 4 hours as needed for worsening shortness of breath, wheezing and cough. If you symptoms do not improve in 24-48 hours, please our office for evaluation and/or prednisone taper.

## 2023-03-22 LAB — COLOGUARD: COLOGUARD: NEGATIVE

## 2023-04-01 ENCOUNTER — Other Ambulatory Visit (HOSPITAL_BASED_OUTPATIENT_CLINIC_OR_DEPARTMENT_OTHER): Payer: Self-pay | Admitting: Family Medicine

## 2023-04-02 ENCOUNTER — Other Ambulatory Visit (HOSPITAL_COMMUNITY): Payer: Self-pay

## 2023-04-02 MED ORDER — LIOTHYRONINE SODIUM 5 MCG PO TABS
5.0000 ug | ORAL_TABLET | Freq: Every day | ORAL | 0 refills | Status: DC
  Filled 2023-04-10: qty 30, 30d supply, fill #0

## 2023-04-10 ENCOUNTER — Other Ambulatory Visit: Payer: Self-pay

## 2023-04-10 ENCOUNTER — Other Ambulatory Visit (HOSPITAL_COMMUNITY): Payer: Self-pay

## 2023-04-11 ENCOUNTER — Other Ambulatory Visit (HOSPITAL_BASED_OUTPATIENT_CLINIC_OR_DEPARTMENT_OTHER): Payer: Medicaid Other

## 2023-05-07 ENCOUNTER — Other Ambulatory Visit (HOSPITAL_BASED_OUTPATIENT_CLINIC_OR_DEPARTMENT_OTHER): Payer: Medicaid Other

## 2023-05-09 ENCOUNTER — Other Ambulatory Visit (HOSPITAL_BASED_OUTPATIENT_CLINIC_OR_DEPARTMENT_OTHER): Payer: Medicaid Other

## 2023-05-09 DIAGNOSIS — E119 Type 2 diabetes mellitus without complications: Secondary | ICD-10-CM

## 2023-05-10 LAB — HEMOGLOBIN A1C
Est. average glucose Bld gHb Est-mCnc: 157 mg/dL
Hgb A1c MFr Bld: 7.1 % — ABNORMAL HIGH (ref 4.8–5.6)

## 2023-05-10 LAB — TSH: TSH: 1.6 u[IU]/mL (ref 0.450–4.500)

## 2023-05-14 ENCOUNTER — Ambulatory Visit (HOSPITAL_BASED_OUTPATIENT_CLINIC_OR_DEPARTMENT_OTHER): Payer: Medicaid Other | Admitting: Family Medicine

## 2023-05-14 DIAGNOSIS — E063 Autoimmune thyroiditis: Secondary | ICD-10-CM

## 2023-05-14 DIAGNOSIS — E119 Type 2 diabetes mellitus without complications: Secondary | ICD-10-CM | POA: Diagnosis not present

## 2023-05-14 DIAGNOSIS — E038 Other specified hypothyroidism: Secondary | ICD-10-CM | POA: Diagnosis not present

## 2023-05-14 MED ORDER — LEVOTHYROXINE SODIUM 100 MCG PO TABS
100.0000 ug | ORAL_TABLET | Freq: Every day | ORAL | 1 refills | Status: DC
Start: 2023-05-14 — End: 2023-11-30

## 2023-05-14 MED ORDER — LIOTHYRONINE SODIUM 5 MCG PO TABS: 5.0000 ug | ORAL_TABLET | Freq: Every day | ORAL | 1 refills | Status: DC

## 2023-05-14 NOTE — Patient Instructions (Signed)
  Medication Instructions:  Your physician recommends that you continue on your current medications as directed. Please refer to the Current Medication list given to you today. --If you need a refill on any your medications before your next appointment, please call your pharmacy first. If no refills are authorized on file call the office.-- Lab Work: Your physician has recommended that you have lab work today: yes If you have labs (blood work) drawn today and your tests are completely normal, you will receive your results via MyChart message OR a phone call from our staff.  Please ensure you check your voicemail in the event that you authorized detailed messages to be left on a delegated number. If you have any lab test that is abnormal or we need to change your treatment, we will call you to review the results.    Follow-Up: Your next appointment:   Your physician recommends that you schedule a follow-up appointment in: 3 months with Dr. de Peru  You will receive a text message or e-mail with a link to a survey about your care and experience with [place] today! We would greatly appreciate your feedback!   Thanks for letting us be apart of your health journey!!  Primary Care and Sports Medicine   Dr. Ceasar Mons Peru   We encourage you to activate your patient portal called "MyChart".  Sign up information is provided on this After Visit Summary.  MyChart is used to connect with patients for Virtual Visits (Telemedicine).  Patients are able to view lab/test results, encounter notes, upcoming appointments, etc.  Non-urgent messages can be sent to your provider as well. To learn more about what you can do with MyChart, please visit --  [email or web address].

## 2023-05-14 NOTE — Assessment & Plan Note (Signed)
A1c completed recently which showed slight increase from prior reading, is now at 7.1%.  This is slightly above goal of less than 7.0%.  She continues with lifestyle modifications, thinks that she could be making further improvements in this regard. She has wanted to hold off on pharmacotherapy given that she is currently breast-feeding and would consider pharmacotherapy once she has stopped breast-feeding.  She is planning to continue breast-feeding until daughter is 20 years old, this will be in about 5 months. Again discussed possibility of referral to nutritionist, patient declines given that this is something that she had engaged in when diagnosed with gestational diabetes She will continue to work on lifestyle modifications.  Continue to hold off on pharmacotherapy for now.  Once able to, can consider metformin, GLP-1 receptor agonist Discussed recommendations for pneumococcal vaccination, she would prefer to wait until she is done breast-feeding, did reviewed general safety considerations. Can proceed with nephropathy screening today Foot exam completed in office today, documented in chart Discussed recommendations for retinopathy screening, she does have eye doctor that she follows with.  She will discuss with her eye doctor to ensure that this is something that they are able to do.  If they are not, advised that we can complete retinopathy screening here in the office, next planned timing for this is in October Plan for follow-up in about 3 months to monitor progress

## 2023-05-14 NOTE — Assessment & Plan Note (Signed)
Recent TSH is now within normal range.  She continues with levothyroxine and liothyronine as prescribed.  Denies any current issues or symptoms. Given that TSH is now within normal range, can continue with current regimen, refill provided today

## 2023-05-14 NOTE — Progress Notes (Signed)
    Procedures performed today:    None.  Independent interpretation of notes and tests performed by another provider:   None.  Brief History, Exam, Impression, and Recommendations:    BP 113/71   Pulse 64   Temp 97.7 F (36.5 C) (Oral)   Ht 5\' 5"  (1.651 m)   Wt 190 lb 6.4 oz (86.4 kg)   SpO2 97%   BMI 31.68 kg/m   Hypothyroidism due to Hashimoto's thyroiditis Assessment & Plan: Recent TSH is now within normal range.  She continues with levothyroxine and liothyronine as prescribed.  Denies any current issues or symptoms. Given that TSH is now within normal range, can continue with current regimen, refill provided today  Orders: -     Levothyroxine Sodium; Take 1 tablet (100 mcg total) by mouth daily before breakfast.  Dispense: 90 tablet; Refill: 1  Type 2 diabetes mellitus without complication, without long-term current use of insulin (HCC) Assessment & Plan: A1c completed recently which showed slight increase from prior reading, is now at 7.1%.  This is slightly above goal of less than 7.0%.  She continues with lifestyle modifications, thinks that she could be making further improvements in this regard. She has wanted to hold off on pharmacotherapy given that she is currently breast-feeding and would consider pharmacotherapy once she has stopped breast-feeding.  She is planning to continue breast-feeding until daughter is 35 years old, this will be in about 5 months. Again discussed possibility of referral to nutritionist, patient declines given that this is something that she had engaged in when diagnosed with gestational diabetes She will continue to work on lifestyle modifications.  Continue to hold off on pharmacotherapy for now.  Once able to, can consider metformin, GLP-1 receptor agonist Discussed recommendations for pneumococcal vaccination, she would prefer to wait until she is done breast-feeding, did reviewed general safety considerations. Can proceed with nephropathy  screening today Foot exam completed in office today, documented in chart Discussed recommendations for retinopathy screening, she does have eye doctor that she follows with.  She will discuss with her eye doctor to ensure that this is something that they are able to do.  If they are not, advised that we can complete retinopathy screening here in the office, next planned timing for this is in October Plan for follow-up in about 3 months to monitor progress  Orders: -     Microalbumin / creatinine urine ratio  Other orders -     Liothyronine Sodium; Take 1 tablet by mouth daily.  Dispense: 90 tablet; Refill: 1  Return in about 3 months (around 08/14/2023) for diabetes, hypothyroidism.   ___________________________________________ Manasvi Dickard de [place], MD, ABFM, CAQSM Primary Care and Sports Medicine [facility]

## 2023-05-15 LAB — MICROALBUMIN / CREATININE URINE RATIO
Creatinine, Urine: 153 mg/dL
Microalb/Creat Ratio: 6 mg/g creat (ref 0–29)
Microalbumin, Urine: 9 ug/mL

## 2023-07-11 LAB — HM DIABETES EYE EXAM

## 2023-07-17 ENCOUNTER — Encounter (HOSPITAL_BASED_OUTPATIENT_CLINIC_OR_DEPARTMENT_OTHER): Payer: Self-pay | Admitting: *Deleted

## 2023-07-25 LAB — HM PAP SMEAR: HPV, high-risk: POSITIVE

## 2023-08-14 ENCOUNTER — Ambulatory Visit (HOSPITAL_BASED_OUTPATIENT_CLINIC_OR_DEPARTMENT_OTHER): Payer: Medicaid Other | Admitting: Family Medicine

## 2023-09-28 ENCOUNTER — Ambulatory Visit
Admission: EM | Admit: 2023-09-28 | Discharge: 2023-09-28 | Disposition: A | Discharge status: Home / Self Care | Payer: Medicaid Other | Attending: Family Medicine | Admitting: Family Medicine

## 2023-09-28 DIAGNOSIS — J208 Acute bronchitis due to other specified organisms: Secondary | ICD-10-CM

## 2023-09-28 DIAGNOSIS — B9689 Other specified bacterial agents as the cause of diseases classified elsewhere: Secondary | ICD-10-CM | POA: Diagnosis not present

## 2023-09-28 MED ORDER — AZITHROMYCIN 250 MG PO TABS: ORAL_TABLET | ORAL | 0 refills | Status: DC

## 2023-09-28 MED ORDER — PREDNISONE 20 MG PO TABS: ORAL_TABLET | ORAL | 0 refills | Status: DC

## 2023-09-28 NOTE — ED Provider Notes (Signed)
 [facility]  Note:  This document was prepared using Conservation officer, historic buildings and may include unintentional dictation errors.  MRN: [id number] DOB: [birth year]  Subjective:   Tracey Nielsen is a 48 y.o. female presenting for 2 week history of persistent congestion, coughing, productive green mucus from her chest.  Has also developed hoarseness, laryngitis.  Has had 1 sick contact with her daughter who she is breast-feeding but not as frequently as when she was a newborn.  Has a remote history of smoking.  However she does have a history of asthma, shortness of breath and wheezing.  Uses her Symbicort .  No current facility-administered medications for this encounter.  Current Outpatient Medications:    albuterol  (VENTOLIN  HFA) 108 (90 Base) MCG/ACT inhaler, Inhale 2 puffs into the lungs every 6 (six) hours as needed for wheezing or shortness of breath., Disp: 8 g, Rfl: 1   budesonide -formoterol  (SYMBICORT ) 160-4.5 MCG/ACT inhaler, Inhale 2 puffs into the lungs in the morning and at bedtime., Disp: 10.2 g, Rfl: 6   cetirizine  (ZYRTEC  ALLERGY) 10 MG tablet, Take 1 tablet (10 mg total) by mouth daily., Disp: 30 tablet, Rfl: 0   levonorgestrel (MIRENA, 52 MG,) 20 MCG/DAY IUD, Device supplied by care center, Disp: , Rfl:    levothyroxine  (SYNTHROID ) 100 MCG tablet, Take 1 tablet (100 mcg total) by mouth daily before breakfast., Disp: 90 tablet, Rfl: 1   liothyronine  (CYTOMEL ) 5 MCG tablet, Take 1 tablet by mouth daily., Disp: 90 tablet, Rfl: 1   Allergies  Allergen Reactions   Amoxicillin Rash    No cross-reaction with penicillin    Past Medical History:  Diagnosis Date   Gestational diabetes    Hypothyroidism    Pregnancy induced hypertension      Past Surgical History:  Procedure Laterality Date   APPENDECTOMY     WISDOM TOOTH EXTRACTION      Family History  Problem Relation Age of Onset   Heart disease Mother    Thyroid disease Brother     Cancer Maternal Aunt     Social History   Tobacco Use   Smoking status: Former    Current packs/day: 0.00    Average packs/day: 1 pack/day for 20.0 years (20.0 ttl pk-yrs)    Types: Cigarettes    Start date: 03/14/1995    Quit date: 03/14/2015    Years since quitting: 8.5  Vaping Use   Vaping status: Never Used  Substance Use Topics   Alcohol use: No   Drug use: No    ROS   Objective:   Vitals: BP 138/82 (BP Location: Right Arm)   Pulse 95   Temp 98.3 F (36.8 C) (Oral)   Resp 17   LMP  (LMP Unknown)   SpO2 93%   Breastfeeding No   Physical Exam Constitutional:      General: She is not in acute distress.    Appearance: Normal appearance. She is well-developed and normal weight. She is not ill-appearing, toxic-appearing or diaphoretic.  HENT:     Head: Normocephalic and atraumatic.     Right Ear: Tympanic membrane, ear canal and external ear normal. No drainage or tenderness. No middle ear effusion. There is no impacted cerumen. Tympanic membrane is not erythematous or bulging.     Left Ear: Tympanic membrane, ear canal and external ear normal. No drainage or tenderness.  No middle ear effusion. There is no impacted cerumen. Tympanic membrane is not erythematous or bulging.     Nose:  Congestion present. No rhinorrhea.     Mouth/Throat:     Mouth: Mucous membranes are moist. No oral lesions.     Pharynx: Posterior oropharyngeal erythema (with associated postnasal drainage and hoarseness) present. No pharyngeal swelling, oropharyngeal exudate or uvula swelling.     Tonsils: No tonsillar exudate or tonsillar abscesses.  Eyes:     General: No scleral icterus.       Right eye: No discharge.        Left eye: No discharge.     Extraocular Movements: Extraocular movements intact.     Right eye: Normal extraocular motion.     Left eye: Normal extraocular motion.     Conjunctiva/sclera: Conjunctivae normal.  Cardiovascular:     Rate and Rhythm: Normal rate and regular  rhythm.     Heart sounds: Normal heart sounds. No murmur heard.    No friction rub. No gallop.  Pulmonary:     Effort: Pulmonary effort is normal. No respiratory distress.     Breath sounds: No stridor. Rhonchi present. No wheezing or rales.  Chest:     Chest wall: No tenderness.  Musculoskeletal:     Cervical back: Normal range of motion and neck supple.  Lymphadenopathy:     Cervical: No cervical adenopathy.  Skin:    General: Skin is warm and dry.  Neurological:     General: No focal deficit present.     Mental Status: She is alert and oriented to person, place, and time.  Psychiatric:        Mood and Affect: Mood normal.        Behavior: Behavior normal.     Assessment and Plan :   PDMP not reviewed this encounter.  1. Acute bacterial bronchitis    Offered imaging but patient declined.  Recommend covering for bacterial bronchitis with azithromycin , prednisone  especially in light of her medication allergies.  Continue with the use of her maintenance breathing treatments.  Counseled patient on potential for adverse effects with medications prescribed/recommended today, ER and return-to-clinic precautions discussed, patient verbalized understanding.    Christopher Savannah, [place] 09/28/23 1521

## 2023-09-28 NOTE — ED Triage Notes (Signed)
 Pt presents with cough, possible laryngitis. States she has been coughing up green mucus.   Home interventions: Mucinex

## 2023-09-28 NOTE — Discharge Instructions (Signed)
 Start prednisone in the mornings immediately after breasfeeding or pumping. Wait 4 hours prior to breastfeeding. Take azithromycin at the same time.

## 2023-10-01 ENCOUNTER — Ambulatory Visit (INDEPENDENT_AMBULATORY_CARE_PROVIDER_SITE_OTHER): Payer: Medicaid Other

## 2023-10-01 ENCOUNTER — Encounter (HOSPITAL_BASED_OUTPATIENT_CLINIC_OR_DEPARTMENT_OTHER): Payer: Self-pay | Admitting: Pulmonary Disease

## 2023-10-01 ENCOUNTER — Telehealth: Payer: Medicaid Other | Admitting: Physician Assistant

## 2023-10-01 ENCOUNTER — Ambulatory Visit
Admission: RE | Admit: 2023-10-01 | Discharge: 2023-10-01 | Disposition: A | Discharge status: Home / Self Care | Payer: Medicaid Other | Source: Ambulatory Visit | Attending: Family Medicine | Admitting: Family Medicine

## 2023-10-01 VITALS — BP 146/82 | HR 97 | Temp 98.6°F | Resp 18

## 2023-10-01 DIAGNOSIS — J209 Acute bronchitis, unspecified: Secondary | ICD-10-CM | POA: Diagnosis not present

## 2023-10-01 DIAGNOSIS — R051 Acute cough: Secondary | ICD-10-CM

## 2023-10-01 DIAGNOSIS — J4530 Mild persistent asthma, uncomplicated: Secondary | ICD-10-CM

## 2023-10-01 DIAGNOSIS — J4541 Moderate persistent asthma with (acute) exacerbation: Secondary | ICD-10-CM | POA: Diagnosis not present

## 2023-10-01 MED ORDER — CEFDINIR 300 MG PO CAPS: 300.0000 mg | ORAL_CAPSULE | Freq: Two times a day (BID) | ORAL | 0 refills | Status: AC

## 2023-10-01 MED ORDER — PREDNISONE 20 MG PO TABS: 40.0000 mg | ORAL_TABLET | Freq: Every day | ORAL | 0 refills | Status: AC

## 2023-10-01 NOTE — ED Provider Notes (Signed)
 [facility]    CSN: [id number] Arrival date & time: 10/01/23  1536      History   Chief Complaint Chief Complaint  Patient presents with   Cough    Return visit. Was told to return for chest xray if not better by 1/5 - Entered by patient    HPI Tracey Nielsen is a 48 y.o. female  presents for evaluation of URI symptoms for 3 weeks. Patient reports associated symptoms of cough with shortness of breath, chest tightness, dizziness, irritated throat, congestion. Denies N/V/D, fevers, ear pain, body aches. Patient does have a hx of asthma.  Has an inhaler and steroid inhaler has been using for symptoms.  Patient is a smoker.  She was seen in urgent care on 1/3 for 2 weeks of symptoms.  She was prescribed Zithromycin and prednisone  which has been taking as prescribed with no improvement in symptoms.   Pt has no other concerns at this time.    Cough Associated symptoms: shortness of breath and sore throat     Past Medical History:  Diagnosis Date   Gestational diabetes    Hypothyroidism    Pregnancy induced hypertension     Patient Active Problem List   Diagnosis Date Noted   Mild persistent asthma 03/05/2023   Wellness examination 02/09/2023   Diabetes mellitus (HCC) 02/09/2023   Wheezing 10/31/2022   Hypothyroidism 10/03/2022   Shortness of breath 10/03/2022   Preeclampsia, severe 09/27/2021   Gestational diabetes mellitus in childbirth, insulin  controlled 09/27/2021   SVD (spontaneous vaginal delivery) 09/27/2021    Past Surgical History:  Procedure Laterality Date   APPENDECTOMY     WISDOM TOOTH EXTRACTION      OB History     Gravida  9   Para  3   Term  2   Preterm  1   AB  6   Living  3      SAB  5   IAB      Ectopic  1   Multiple  0   Live Births  1            Home Medications    Prior to Admission medications   Medication Sig Start Date End Date Taking? Authorizing Provider  cefdinir  (OMNICEF ) 300 MG capsule Take 1  capsule (300 mg total) by mouth 2 (two) times daily for 7 days. 10/01/23 10/08/23 Yes Jacquetta Polhamus, Jodi R, NP  predniSONE  (DELTASONE ) 20 MG tablet Take 2 tablets (40 mg total) by mouth daily with breakfast for 3 days. 10/01/23 10/04/23 Yes Darris Staiger, Jodi R, NP  albuterol  (VENTOLIN  HFA) 108 (90 Base) MCG/ACT inhaler Inhale 2 puffs into the lungs every 6 (six) hours as needed for wheezing or shortness of breath. 03/05/23   Kassie Acquanetta Bradley, MD  azithromycin  (ZITHROMAX ) 250 MG tablet Day 1: take 2 tablets. Day 2-5: Take 1 tablet daily. 09/28/23   Christopher Savannah, PA-C  budesonide -formoterol  (SYMBICORT ) 160-4.5 MCG/ACT inhaler Inhale 2 puffs into the lungs in the morning and at bedtime. 12/01/22   Kassie Acquanetta Bradley, MD  cetirizine  (ZYRTEC  ALLERGY) 10 MG tablet Take 1 tablet (10 mg total) by mouth daily. 10/29/22   Christopher Savannah, PA-C  levonorgestrel (MIRENA, 52 MG,) 20 MCG/DAY IUD Device supplied by care center 08/15/22   [provider]  levothyroxine  (SYNTHROID ) 100 MCG tablet Take 1 tablet (100 mcg total) by mouth daily before breakfast. 05/14/23   de [place], Quintin PARAS, MD  liothyronine  (CYTOMEL ) 5 MCG tablet Take 1 tablet  by mouth daily. 05/14/23   de [place], Raymond J, MD  predniSONE  (DELTASONE ) 20 MG tablet Take 2 tablets daily with breakfast. 09/28/23   Christopher Savannah, PA-C    Family History Family History  Problem Relation Age of Onset   Heart disease Mother    Thyroid disease Brother    Cancer Maternal Aunt     Social History Social History   Tobacco Use   Smoking status: Former    Current packs/day: 0.00    Average packs/day: 1 pack/day for 20.0 years (20.0 ttl pk-yrs)    Types: Cigarettes    Start date: 03/14/1995    Quit date: 03/14/2015    Years since quitting: 8.5  Vaping Use   Vaping status: Never Used  Substance Use Topics   Alcohol use: No   Drug use: No     Allergies   Amoxicillin   Review of Systems Review of Systems  HENT:  Positive for congestion and sore throat.   Respiratory:   Positive for cough, chest tightness and shortness of breath.   Neurological:  Positive for dizziness.     Physical Exam Triage Vital Signs ED Triage Vitals  Encounter Vitals Group     BP 10/01/23 1621 (!) 169/91     Systolic BP Percentile --      Diastolic BP Percentile --      Pulse Rate 10/01/23 1621 97     Resp 10/01/23 1621 18     Temp 10/01/23 1621 98.6 F (37 C)     Temp Source 10/01/23 1621 Oral     SpO2 10/01/23 1621 95 %     Weight --      Height --      Head Circumference --      Peak Flow --      Pain Score 10/01/23 1618 0     Pain Loc --      Pain Education --      Exclude from Growth Chart --    No data found.  Updated Vital Signs BP (!) 146/82 (BP Location: Right Arm)   Pulse 97   Temp 98.6 F (37 C) (Oral)   Resp 18   LMP  (LMP Unknown)   SpO2 95%   Visual Acuity Right Eye Distance:   Left Eye Distance:   Bilateral Distance:    Right Eye Near:   Left Eye Near:    Bilateral Near:     Physical Exam Vitals and nursing note reviewed.  Constitutional:      General: She is not in acute distress.    Appearance: She is well-developed. She is not ill-appearing.  HENT:     Head: Normocephalic and atraumatic.     Right Ear: Tympanic membrane and ear canal normal.     Left Ear: Tympanic membrane and ear canal normal.     Nose: Congestion present.     Mouth/Throat:     Mouth: Mucous membranes are moist.     Pharynx: Oropharynx is clear. Uvula midline. No oropharyngeal exudate or posterior oropharyngeal erythema.     Tonsils: No tonsillar exudate or tonsillar abscesses.  Eyes:     Conjunctiva/sclera: Conjunctivae normal.     Pupils: Pupils are equal, round, and reactive to light.  Cardiovascular:     Rate and Rhythm: Normal rate and regular rhythm.     Heart sounds: Normal heart sounds.  Pulmonary:     Effort: Pulmonary effort is normal.     Breath sounds: Normal breath sounds.  No wheezing.  Musculoskeletal:     Cervical back: Normal range of  motion and neck supple.  Lymphadenopathy:     Cervical: No cervical adenopathy.  Skin:    General: Skin is warm and dry.  Neurological:     General: No focal deficit present.     Mental Status: She is alert and oriented to person, place, and time.  Psychiatric:        Mood and Affect: Mood normal.        Behavior: Behavior normal.      UC Treatments / Results  Labs (all labs ordered are listed, but only abnormal results are displayed) Labs Reviewed - No data to display  EKG   Radiology DG Chest 2 View Result Date: 10/01/2023 CLINICAL DATA:  Cough for several weeks, initial encounter EXAM: CHEST - 2 VIEW COMPARISON:  10/03/2022 FINDINGS: Cardiac shadow is within normal limits. Lungs are well aerated bilaterally. No focal infiltrate or effusion is seen. No bony abnormality is noted. IMPRESSION: No active cardiopulmonary disease. Electronically Signed   By: Oneil Devonshire M.D.   On: 10/01/2023 17:25    Procedures Procedures (including critical care time)  Medications Ordered in UC Medications - No data to display  Initial Impression / Assessment and Plan / UC Course  I have reviewed the triage vital signs and the nursing notes.  Pertinent labs & imaging results that were available during my care of the patient were reviewed by me and considered in my medical decision making (see chart for details).     Reviewed exam and symptoms with patient.  No red flags.  Chest x-ray negative for pneumonia.  Patient presenting with 3 weeks of cough with asthma symptoms.  Already on Zithromax  and prednisone  with minimal improvement.  Will extend prednisone  by 3 days and add on cefdinir .  Advised patient to follow-up with her PCP or pulmonologist in 1 to 2 days for recheck.  She was instructed to go to the ER for any worsening symptoms, red flags reviewed and patient verbalized understanding. Final Clinical Impressions(s) / UC Diagnoses   Final diagnoses:  Acute cough  Moderate persistent  asthma with acute exacerbation  Acute bronchitis, unspecified organism     Discharge Instructions      Continue Zithromax  as you are previously prescribed.  Continue the prednisone  daily and I will add on an additional 3 days so you will be taking this daily for 8 days.  Also added on cefdinir  and antibiotic that you will take twice a day for 7 days.  Continue your inhalers as needed.  Please follow-up with either your pulmonologist or your PCP in 2 days for recheck.  Please go to the ER if you develop any worsening symptoms specifically you are unable to control your asthma symptoms.  I hope you feel better soon!     ED Prescriptions     Medication Sig Dispense Auth. Provider   predniSONE  (DELTASONE ) 20 MG tablet Take 2 tablets (40 mg total) by mouth daily with breakfast for 3 days. 6 tablet Dave Mannes, Jodi R, NP   cefdinir  (OMNICEF ) 300 MG capsule Take 1 capsule (300 mg total) by mouth 2 (two) times daily for 7 days. 14 capsule Naraya Stoneberg, Jodi R, NP      PDMP not reviewed this encounter.   Loreda Myla SAUNDERS, NP 10/01/23 1729

## 2023-10-01 NOTE — Progress Notes (Signed)
 I was able to view your visit to UC today and I agree with them and  I feel your condition warrants further evaluation and I recommend that you be seen for a face to face visit.  Please contact your primary care physician practice to be seen.   NOTE: You will NOT be charged for this eVisit.  If you do not have a PCP, [facility] offers a free physician referral service available at [phone]. Our trained staff has the experience, knowledge and resources to put you in touch with a physician who is right for you.    If you are having a true medical emergency please call 911.   Your e-visit answers were reviewed by a board certified advanced clinical practitioner to complete your personal care plan.  Thank you for using e-Visits.

## 2023-10-01 NOTE — Discharge Instructions (Addendum)
 Continue Zithromax  as you are previously prescribed.  Continue the prednisone  daily and I will add on an additional 3 days so you will be taking this daily for 8 days.  Also added on cefdinir  and antibiotic that you will take twice a day for 7 days.  Continue your inhalers as needed.  Please follow-up with either your pulmonologist or your PCP in 2 days for recheck.  Please go to the ER if you develop any worsening symptoms specifically you are unable to control your asthma symptoms.  I hope you feel better soon!

## 2023-10-01 NOTE — ED Triage Notes (Signed)
 Pt presents to UC for c/o productive cough, dizziness that has worsened since being seen 3 days ago. She has been taking prescribed medications and inhalers w/o improvement.

## 2023-10-05 ENCOUNTER — Telehealth (HOSPITAL_BASED_OUTPATIENT_CLINIC_OR_DEPARTMENT_OTHER): Payer: Medicaid Other | Admitting: Pulmonary Disease

## 2023-10-05 ENCOUNTER — Encounter (HOSPITAL_BASED_OUTPATIENT_CLINIC_OR_DEPARTMENT_OTHER): Payer: Self-pay | Admitting: Pulmonary Disease

## 2023-10-05 DIAGNOSIS — J4531 Mild persistent asthma with (acute) exacerbation: Secondary | ICD-10-CM

## 2023-10-05 MED ORDER — SYMBICORT 80-4.5 MCG/ACT IN AERO: 2.0000 | INHALATION_SPRAY | Freq: Two times a day (BID) | RESPIRATORY_TRACT | 0 refills | Status: DC | PRN

## 2023-10-05 MED ORDER — PREDNISONE 10 MG PO TABS: ORAL_TABLET | ORAL | 0 refills | Status: AC

## 2023-10-05 MED ORDER — ALBUTEROL SULFATE (2.5 MG/3ML) 0.083% IN NEBU: 2.5000 mg | INHALATION_SOLUTION | Freq: Four times a day (QID) | RESPIRATORY_TRACT | 0 refills | Status: DC | PRN

## 2023-10-05 NOTE — Patient Instructions (Signed)
 Mild persistent asthma --Complete prednisone  and antibiotic tomorrow --Prednisone  taper ordered if symptoms persistent.  --CONTINUE Symbicort  160-4.5 mcg puffs in the morning and evening. Rinse out mouth after use --START low dose Symbicort  TWO puffs in the afternoon --CONTINUE Albuterol . Discontinue Xopenex  --Restart mucinex  BID --ORDER albuterol  nebulizer solution AS NEEDED. Will need nebulizer device

## 2023-10-05 NOTE — Progress Notes (Signed)
 Virtual Visit via Video Note  I connected with Tracey Nielsen on 10/05/23 at  2:45 PM EST by a video enabled telemedicine application and verified that I am speaking with the correct person using two identifiers.  Location: Patient: Home Provider: Home   I discussed the limitations of evaluation and management by telemedicine and the availability of in person appointments. The patient expressed understanding and agreed to proceed.   I discussed the assessment and treatment plan with the patient. The patient was provided an opportunity to ask questions and all were answered. The patient agreed with the plan and demonstrated an understanding of the instructions.   The patient was advised to call back or seek an in-person evaluation if the symptoms worsen or if the condition fails to improve as anticipated.  I provided 30 minutes of non-face-to-face time during this encounter.   Daryus Sowash Slater Staff, MD  Subjective:   PATIENT ID: Tracey Nielsen GENDER: female DOB: [birth year], MRN: [id number]  Chief Complaint  Patient presents with   Follow-up    Recent urgent care for asthma    Reason for Visit: Follow-up  Ms. Tracey Nielsen is a 48 year old former smoker with HTN, hx gestational DM2, hashimotos thyroiditis who presents for follow-up.  Initial consult She reports cold/pneumonia in October 2023, three months ago. Has had persistent cough associated with shortness of breath. Worsens with activity. Improves with rest and albuterol  inhaler. Records reviewed from PCP 10/03/22. Started on albuterol  and referred to Pulmonary for further evaluation.  She has had another cold this week requiring prednisone  and antibiotic (cefdinir ) that was prescribed by urgent care on 10/29/22. Needing to use albuterol  every 2 hours. She has a newborn at home and she finds herself coughing frequently at night. She reports a history of smoker's congestion/cough. She feels she is having difficulty getting a  deep breath compared to before October.  03/13/23 Compliant with Symbicort . Not needing rescue inhaler. Presents today to review PFTs  10/05/23 Since our last visit she has been to the urgent care x 2 on 1/3 and 1/6. She was initially treated with prednisone  and azithromycin . When she returned for persistent symptoms and CXR neg for infiltrate but cefdinir  was added to her regimen. Today she reports she is compliant with Symbicort  and taking albuterol  twice a day. She has nearly completed 7 days of prednisone  40 mg daily. She reports sinus symptoms have improved. Her cough is productive and less green and remains thick. Chest congestion.       No data to display         Social History: Former smoker. Quit in 2016. 1 ppd x 20 years. Started smoking 48 years old.  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   Past Medical History:  Diagnosis Date   Gestational diabetes    Hypothyroidism    Pregnancy induced hypertension      Family History  Problem Relation Age of Onset   Heart disease Mother    Thyroid disease Brother    Cancer Maternal Aunt      Social History   Occupational History   Not on file  Tobacco Use   Smoking status: Former    Current packs/day: 0.00    Average packs/day: 1 pack/day for 20.0 years (20.0 ttl pk-yrs)    Types: Cigarettes    Start date: 03/14/1995    Quit date: 03/14/2015    Years since quitting: 8.5   Smokeless tobacco: Not on file  Vaping Use  Vaping status: Never Used  Substance and Sexual Activity   Alcohol use: No   Drug use: No   Sexual activity: Yes    Allergies  Allergen Reactions   Amoxicillin Rash    No cross-reaction with penicillin     Outpatient Medications Prior to Visit  Medication Sig Dispense Refill   albuterol  (VENTOLIN  HFA) 108 (90 Base) MCG/ACT inhaler Inhale 2 puffs into the lungs every 6 (six) hours as needed for wheezing or shortness of breath. 8 g 1   azithromycin  (ZITHROMAX ) 250 MG tablet Day 1: take 2 tablets.  Day 2-5: Take 1 tablet daily. 6 tablet 0   budesonide -formoterol  (SYMBICORT ) 160-4.5 MCG/ACT inhaler Inhale 2 puffs into the lungs in the morning and at bedtime. 10.2 g 6   cefdinir  (OMNICEF ) 300 MG capsule Take 1 capsule (300 mg total) by mouth 2 (two) times daily for 7 days. 14 capsule 0   cetirizine  (ZYRTEC  ALLERGY) 10 MG tablet Take 1 tablet (10 mg total) by mouth daily. 30 tablet 0   levonorgestrel (MIRENA, 52 MG,) 20 MCG/DAY IUD Device supplied by care center     levothyroxine  (SYNTHROID ) 100 MCG tablet Take 1 tablet (100 mcg total) by mouth daily before breakfast. 90 tablet 1   liothyronine  (CYTOMEL ) 5 MCG tablet Take 1 tablet by mouth daily. 90 tablet 1   predniSONE  (DELTASONE ) 20 MG tablet Take 2 tablets daily with breakfast. 10 tablet 0   No facility-administered medications prior to visit.    Review of Systems  Constitutional:  Negative for chills, diaphoresis, fever, malaise/fatigue and weight loss.  HENT:  Negative for congestion.   Respiratory:  Positive for cough and sputum production. Negative for hemoptysis, shortness of breath and wheezing.   Cardiovascular:  Negative for chest pain, palpitations and leg swelling.     Objective:   There were no vitals filed for this visit.    Physical Exam: General: Well-appearing, no acute distress HENT: [place], AT Eyes: EOMI, no scleral icterus Respiratory: No respiratory distress Neuro: AAO x4, CNII-XII grossly intact Psych: Normal mood, normal affect   Data Reviewed:  Imaging: CXR 07/19/22 - No infiltrate, effusion or edema CXR 10/03/22 - No infiltrate, effusion or edema CXR 10/01/23 - No infiltrate effusion or edema  PFT: 11/27/22 FVC 3.04 (79%) FEV1 3.21 (83%) Ratio 78  TLC 97% DLCO 104% Interpretation: Normal spirometry with significant bronchodilator response consistent with asthma  Labs: CBC    Component Value Date/Time   WBC 9.6 01/31/2023 0844   WBC 9.8 10/02/2021 2302   RBC 4.79 01/31/2023 0844   RBC 4.29  10/02/2021 2302   HGB 13.9 01/31/2023 0844   HCT 41.3 01/31/2023 0844   PLT 361 01/31/2023 0844   MCV 86 01/31/2023 0844   MCH 29.0 01/31/2023 0844   MCH 30.8 10/02/2021 2302   MCHC 33.7 01/31/2023 0844   MCHC 33.4 10/02/2021 2302   RDW 13.0 01/31/2023 0844   LYMPHSABS 2.7 01/31/2023 0844   MONOABS 0.8 09/27/2021 1800   EOSABS 0.4 01/31/2023 0844   BASOSABS 0.1 01/31/2023 0844   Abs eos 09/27/21 - 100     Assessment & Plan:   Discussion: 48 year old former smoker with asthma, HTN, hx gestational DM2, hashimotos thyroiditis who presents for follow-up asthma follow-up and recent exacerbation. Improved but persistent symptoms. Counseled on potential of prolonged symptoms with asthma. Additional prednisone  course offered in addition to PRN low dose Symbicort . No additional antibiotics indicated. Recent CXR neg for pneumonia  Mild persistent asthma --Complete prednisone   and antibiotic tomorrow --Prednisone  taper ordered if symptoms persistent.  --CONTINUE Symbicort  160-4.5 mcg puffs in the morning and evening. Rinse out mouth after use --START low dose Symbicort  TWO puffs in the afternoon --CONTINUE Albuterol . Discontinue Xopenex  --Restart mucinex  BID --ORDER albuterol  nebulizer solution AS NEEDED. Will need nebulizer device  Asthma Action Plan Use Albuterol  every 4 hours as needed for worsening shortness of breath, wheezing and cough. If you symptoms do not improve in 24-48 hours, please our office for evaluation and/or prednisone  taper.  Health Maintenance Immunization History  Administered Date(s) Administered   Influenza Inj Mdck Quad Pf 09/07/2020   Influenza,inj,Quad PF,6+ Mos 07/14/2021, 10/03/2022   Influenza-Unspecified 09/07/2020   PFIZER(Purple Top)SARS-COV-2 Vaccination 12/13/2019, 01/05/2020, 08/27/2020   Tdap 07/14/2010, 07/27/2021   CT Lung Screen - not qualified due to age. Reassess at 55  No orders of the defined types were placed in this encounter.  Meds  ordered this encounter  Medications   SYMBICORT  80-4.5 MCG/ACT inhaler    Sig: Inhale 2 puffs into the lungs 2 (two) times daily as needed.    Dispense:  1 each    Refill:  0    Brand only   albuterol  (PROVENTIL ) (2.5 MG/3ML) 0.083% nebulizer solution    Sig: Take 3 mLs (2.5 mg total) by nebulization every 6 (six) hours as needed for wheezing or shortness of breath.    Dispense:  75 mL    Refill:  0   predniSONE  (DELTASONE ) 10 MG tablet    Sig: Take 3 tablets (30 mg total) by mouth daily with breakfast for 2 days, THEN 2 tablets (20 mg total) daily with breakfast for 2 days, THEN 1 tablet (10 mg total) daily with breakfast for 2 days, THEN 1 tablet (10 mg total) daily with breakfast for 2 days.    Dispense:  14 tablet    Refill:  0    No follow-ups on file.  I have spent a total time of 30-minutes on the day of the appointment including chart review, data review, collecting history, coordinating care and discussing medical diagnosis and plan with the patient/family. Past medical history, allergies, medications were reviewed. Pertinent imaging, labs and tests included in this note have been reviewed and interpreted independently by me.  Parris Cudworth Slater Staff, MD [facility] 10/05/2023 4:20 PM  Office Number [phone]

## 2023-10-24 ENCOUNTER — Encounter (HOSPITAL_BASED_OUTPATIENT_CLINIC_OR_DEPARTMENT_OTHER): Payer: Self-pay

## 2023-11-30 ENCOUNTER — Other Ambulatory Visit (HOSPITAL_BASED_OUTPATIENT_CLINIC_OR_DEPARTMENT_OTHER): Payer: Self-pay | Admitting: Family Medicine

## 2023-11-30 DIAGNOSIS — E063 Autoimmune thyroiditis: Secondary | ICD-10-CM

## 2023-12-03 ENCOUNTER — Encounter (HOSPITAL_BASED_OUTPATIENT_CLINIC_OR_DEPARTMENT_OTHER): Payer: Self-pay | Admitting: *Deleted

## 2023-12-03 DIAGNOSIS — E063 Autoimmune thyroiditis: Secondary | ICD-10-CM

## 2023-12-03 DIAGNOSIS — E119 Type 2 diabetes mellitus without complications: Secondary | ICD-10-CM

## 2023-12-03 NOTE — Telephone Encounter (Signed)
Mychart msg sent to patient to schedule.

## 2023-12-10 ENCOUNTER — Other Ambulatory Visit: Payer: Self-pay | Admitting: Pulmonary Disease

## 2024-01-24 ENCOUNTER — Ambulatory Visit (HOSPITAL_BASED_OUTPATIENT_CLINIC_OR_DEPARTMENT_OTHER): Admitting: Family Medicine

## 2024-02-27 ENCOUNTER — Other Ambulatory Visit (HOSPITAL_BASED_OUTPATIENT_CLINIC_OR_DEPARTMENT_OTHER): Payer: Self-pay | Admitting: Family Medicine

## 2024-02-27 ENCOUNTER — Other Ambulatory Visit (HOSPITAL_COMMUNITY): Payer: Self-pay | Admitting: Family Medicine

## 2024-02-28 LAB — BASIC METABOLIC PANEL WITH GFR
BUN/Creatinine Ratio: 15 (ref 9–23)
BUN: 13 mg/dL (ref 6–24)
CO2: 18 mmol/L — ABNORMAL LOW (ref 20–29)
Calcium: 10.1 mg/dL (ref 8.7–10.2)
Chloride: 103 mmol/L (ref 96–106)
Creatinine, Ser: 0.86 mg/dL (ref 0.57–1.00)
Glucose: 141 mg/dL — ABNORMAL HIGH (ref 70–99)
Potassium: 4.6 mmol/L (ref 3.5–5.2)
Sodium: 140 mmol/L (ref 134–144)
eGFR: 83 mL/min/{1.73_m2} (ref >59)

## 2024-02-28 LAB — TSH: TSH: 3.68 u[IU]/mL (ref 0.450–4.500)

## 2024-02-28 LAB — HEMOGLOBIN A1C
Est. average glucose Bld gHb Est-mCnc: 103 mg/dL
Hgb A1c MFr Bld: 5.2 % (ref 4.8–5.6)

## 2024-03-03 ENCOUNTER — Ambulatory Visit (HOSPITAL_BASED_OUTPATIENT_CLINIC_OR_DEPARTMENT_OTHER): Admitting: Family Medicine

## 2024-03-03 ENCOUNTER — Encounter (HOSPITAL_BASED_OUTPATIENT_CLINIC_OR_DEPARTMENT_OTHER): Payer: Self-pay | Admitting: Family Medicine

## 2024-03-03 ENCOUNTER — Encounter (HOSPITAL_BASED_OUTPATIENT_CLINIC_OR_DEPARTMENT_OTHER): Payer: Self-pay | Admitting: *Deleted

## 2024-03-03 VITALS — BP 131/84 | HR 79 | Ht 65.0 in | Wt 184.0 lb

## 2024-03-03 DIAGNOSIS — E119 Type 2 diabetes mellitus without complications: Secondary | ICD-10-CM

## 2024-03-03 DIAGNOSIS — Z0000 Encounter for general adult medical examination without abnormal findings: Secondary | ICD-10-CM

## 2024-03-03 DIAGNOSIS — E063 Autoimmune thyroiditis: Secondary | ICD-10-CM

## 2024-03-03 NOTE — Patient Instructions (Signed)
  Medication Instructions:  Your physician recommends that you continue on your current medications as directed. Please refer to the Current Medication list given to you today. --If you need a refill on any your medications before your next appointment, please call your pharmacy first. If no refills are authorized on file call the office.-- Lab Work: Your physician has recommended that you have lab work today: 1 week before next visit  If you have labs (blood work) drawn today and your tests are completely normal, you will receive your results via MyChart message OR a phone call from our staff.  Please ensure you check your voicemail in the event that you authorized detailed messages to be left on a delegated number. If you have any lab test that is abnormal or we need to change your treatment, we will call you to review the results.  Follow-Up: Your next appointment:   Your physician recommends that you schedule a follow-up appointment in: 6 month physical with Dr. de [place]  You will receive a text message or e-mail with a link to a survey about your care and experience with [place] today! We would greatly appreciate your feedback!   Thanks for letting us be apart of your health journey!!  Primary Care and Sports Medicine   Dr. Ceasar Mons Peru   We encourage you to activate your patient portal called "MyChart".  Sign up information is provided on this After Visit Summary.  MyChart is used to connect with patients for Virtual Visits (Telemedicine).  Patients are able to view lab/test results, encounter notes, upcoming appointments, etc.  Non-urgent messages can be sent to your provider as well. To learn more about what you can do with MyChart, please visit --  [email or web address].

## 2024-03-03 NOTE — Progress Notes (Unsigned)
    Procedures performed today:    None.  Independent interpretation of notes and tests performed by another provider:   None.  Brief History, Exam, Impression, and Recommendations:    BP 131/84 (BP Location: Right Arm, Patient Position: Sitting, Cuff Size: Normal)   Pulse 79   Ht 5\' 5"  (1.651 m)   Wt 184 lb (83.5 kg)   LMP 03/03/2024   SpO2 94%   BMI 30.62 kg/m   There are no diagnoses linked to this encounter.No follow-ups on file.   ___________________________________________ Tameia Rafferty de [place], MD, ABFM, [facility]

## 2024-03-05 NOTE — Assessment & Plan Note (Signed)
 Recent hemoglobin A1c is at goal at 5.2%.  She does continue with breast-feeding which has been primary reason for avoiding any medication up to this point.  She has primarily been focusing on lifestyle modifications. Given current progress, can continue with lifestyle modifications.  We will continue with monitoring A1c intermittently.  Will plan to follow-up in about 6 months to complete physical with labs 1 week prior.  If having any new symptoms such as polyuria or polydipsia, recommend returning to the office sooner

## 2024-03-05 NOTE — Assessment & Plan Note (Signed)
 Recent TSH remains within normal range.  She continues with levothyroxine  and liothyronine  as prescribed.  Denies any current issues or symptoms. Given that TSH is now within normal range, can continue with current regimen

## 2024-03-10 ENCOUNTER — Ambulatory Visit (HOSPITAL_BASED_OUTPATIENT_CLINIC_OR_DEPARTMENT_OTHER): Admitting: Family Medicine

## 2024-05-29 ENCOUNTER — Other Ambulatory Visit (HOSPITAL_BASED_OUTPATIENT_CLINIC_OR_DEPARTMENT_OTHER): Payer: Self-pay | Admitting: Family Medicine

## 2024-05-29 DIAGNOSIS — E063 Autoimmune thyroiditis: Secondary | ICD-10-CM

## 2024-06-08 ENCOUNTER — Other Ambulatory Visit (HOSPITAL_BASED_OUTPATIENT_CLINIC_OR_DEPARTMENT_OTHER): Payer: Self-pay | Admitting: Pulmonary Disease

## 2024-06-10 ENCOUNTER — Ambulatory Visit (INDEPENDENT_AMBULATORY_CARE_PROVIDER_SITE_OTHER)

## 2024-06-10 DIAGNOSIS — Z23 Encounter for immunization: Secondary | ICD-10-CM

## 2024-06-10 NOTE — Progress Notes (Signed)
 Patient is in office today for a nurse visit for Immunization. Patient Injection was given in the  Left arm. Patient tolerated injection well. Flu vaccine. Right arm PCV 21.

## 2024-06-10 NOTE — Addendum Note (Signed)
 Addended by: HERMINE LATUS R on: 06/10/2024 10:02 AM   Modules accepted: Orders

## 2024-09-02 ENCOUNTER — Encounter (HOSPITAL_BASED_OUTPATIENT_CLINIC_OR_DEPARTMENT_OTHER): Admitting: Family Medicine

## 2024-10-28 ENCOUNTER — Other Ambulatory Visit (HOSPITAL_BASED_OUTPATIENT_CLINIC_OR_DEPARTMENT_OTHER): Payer: Self-pay | Admitting: Pulmonary Disease

## 2024-10-29 MED ORDER — ALBUTEROL SULFATE (2.5 MG/3ML) 0.083% IN NEBU: 2.5000 mg | INHALATION_SOLUTION | Freq: Four times a day (QID) | RESPIRATORY_TRACT | 0 refills | Status: AC | PRN

## 2024-10-29 NOTE — Addendum Note (Signed)
 Addended byBETHA JESSICA BOUCHARD S on: 10/29/2024 01:04 PM   Modules accepted: Orders

## 2024-10-29 NOTE — Telephone Encounter (Signed)
 Courtesy albuterol  refill. Pt will need appt for further refills.

## 2024-12-16 ENCOUNTER — Encounter (HOSPITAL_BASED_OUTPATIENT_CLINIC_OR_DEPARTMENT_OTHER): Admitting: Family Medicine
